# Patient Record
Sex: Female | Born: 1945 | Race: White | Hispanic: No | Marital: Married | Smoking: Never smoker
Health system: Southern US, Community
[De-identification: ages and names within clinical notes are randomized; demographics above are authoritative.]

## PROBLEM LIST (undated history)

## (undated) DIAGNOSIS — M1990 Unspecified osteoarthritis, unspecified site: Secondary | ICD-10-CM

## (undated) DIAGNOSIS — E785 Hyperlipidemia, unspecified: Secondary | ICD-10-CM

## (undated) DIAGNOSIS — T7840XA Allergy, unspecified, initial encounter: Secondary | ICD-10-CM

## (undated) DIAGNOSIS — Z9889 Other specified postprocedural states: Secondary | ICD-10-CM

## (undated) DIAGNOSIS — R112 Nausea with vomiting, unspecified: Secondary | ICD-10-CM

## (undated) DIAGNOSIS — E079 Disorder of thyroid, unspecified: Secondary | ICD-10-CM

## (undated) DIAGNOSIS — M810 Age-related osteoporosis without current pathological fracture: Secondary | ICD-10-CM

## (undated) DIAGNOSIS — J309 Allergic rhinitis, unspecified: Secondary | ICD-10-CM

## (undated) DIAGNOSIS — J45909 Unspecified asthma, uncomplicated: Secondary | ICD-10-CM

## (undated) HISTORY — DX: Allergy, unspecified, initial encounter: T78.40XA

## (undated) HISTORY — DX: Unspecified asthma, uncomplicated: J45.909

## (undated) HISTORY — DX: Unspecified osteoarthritis, unspecified site: M19.90

## (undated) HISTORY — DX: Allergic rhinitis, unspecified: J30.9

## (undated) HISTORY — DX: Disorder of thyroid, unspecified: E07.9

## (undated) HISTORY — DX: Age-related osteoporosis without current pathological fracture: M81.0

## (undated) HISTORY — DX: Other specified postprocedural states: R11.2

## (undated) HISTORY — DX: Hyperlipidemia, unspecified: E78.5

## (undated) HISTORY — DX: Other specified postprocedural states: Z98.890

## (undated) HISTORY — PX: WRIST SURGERY: SHX841

## (undated) HISTORY — PX: TUBAL LIGATION: SHX77

---

## 2014-10-21 DIAGNOSIS — J309 Allergic rhinitis, unspecified: Secondary | ICD-10-CM

## 2014-10-21 DIAGNOSIS — J4520 Mild intermittent asthma, uncomplicated: Secondary | ICD-10-CM | POA: Insufficient documentation

## 2014-10-21 DIAGNOSIS — J3089 Other allergic rhinitis: Secondary | ICD-10-CM | POA: Insufficient documentation

## 2014-11-16 ENCOUNTER — Ambulatory Visit (INDEPENDENT_AMBULATORY_CARE_PROVIDER_SITE_OTHER): Payer: Medicare Other | Admitting: *Deleted

## 2014-11-16 DIAGNOSIS — J309 Allergic rhinitis, unspecified: Secondary | ICD-10-CM | POA: Diagnosis not present

## 2014-12-13 ENCOUNTER — Ambulatory Visit (INDEPENDENT_AMBULATORY_CARE_PROVIDER_SITE_OTHER): Payer: Medicare Other

## 2014-12-13 DIAGNOSIS — J309 Allergic rhinitis, unspecified: Secondary | ICD-10-CM

## 2014-12-22 ENCOUNTER — Ambulatory Visit (INDEPENDENT_AMBULATORY_CARE_PROVIDER_SITE_OTHER): Payer: Medicare Other | Admitting: Internal Medicine

## 2014-12-22 ENCOUNTER — Encounter: Payer: Self-pay | Admitting: Internal Medicine

## 2014-12-22 VITALS — BP 140/86 | HR 86 | Temp 98.3°F | Resp 16

## 2014-12-22 DIAGNOSIS — J301 Allergic rhinitis due to pollen: Secondary | ICD-10-CM

## 2014-12-22 DIAGNOSIS — J4520 Mild intermittent asthma, uncomplicated: Secondary | ICD-10-CM

## 2014-12-22 NOTE — Assessment & Plan Note (Signed)
   Intermittent, currently well controlled.  Continue as needed Proventil.

## 2014-12-22 NOTE — Assessment & Plan Note (Addendum)
   On immunotherapy, currently not well controlled possibly due to fall allergens versus viral syndrome.  Continue Allegra 180 mg daily. Increase fluticasone to 1 spray each nostril twice a day.  Prior to nasal sprays, perform nasal saline lavage for thick mucus twice a day.  Given Prednisone 10 mg tablets.  One tablet twice a day for four days, then one tablet on day #5.  Has EpiPen and action plan-educated on use.

## 2014-12-22 NOTE — Progress Notes (Signed)
12/22/2014  Rebecca Murray Dec 25, 1945 [id number]  Referring provider: Albertina SenegalNelson Pollock, MD [address] [place], [place] [address]  Chief Complaint: Follow-up   Rebecca Murray is a 69 y.o. female who is being seen today for follow-up.   HPI Comments: Allergic rhinitis on immunotherapy: She is on shots every 4 weeks. She has not had any shot reactions. This fall she has had worsening symptoms of sore throat, clear drainage, dry cough. She has not had any interval sinus infections. She does have an EpiPen.  Asthma: Symptoms have been stable since her last visit without any exacerbations or need for her rescue inhaler. She has received a flu shot this year.    ROS: Per HPI unless specifically indicated below Review of Systems   Drug Allergies:  Allergies  Allergen Reactions  . Dextromethorphan Nausea Only    Medications:  Current outpatient prescriptions:  .  albuterol (PROVENTIL HFA) 108 (90 BASE) MCG/ACT inhaler, Inhale 2 puffs into the lungs every 6 (six) hours as needed for wheezing or shortness of breath., Disp: , Rfl:  .  EPINEPHrine (EPIPEN 2-PAK) 0.3 mg/0.3 mL IJ SOAJ injection, Inject 0.3 mg into the muscle Once PRN., Disp: , Rfl:  .  fexofenadine (ALLEGRA) 180 MG tablet, Take 180 mg by mouth daily., Disp: , Rfl:  .  fluticasone (FLONASE) 50 MCG/ACT nasal spray, Place 1 spray into both nostrils 2 (two) times daily., Disp: , Rfl:  .  Multiple Vitamins-Minerals (MULTIVITAMIN ADULT PO), Take by mouth daily., Disp: , Rfl:  .  NON FORMULARY, Every week, Disp: , Rfl:   Physical Exam: BP 140/86 mmHg  Pulse 86  Temp(Src) 98.3 F (36.8 C) (Oral)  Resp 16  Physical Exam  Constitutional: She appears well-developed and well-nourished. No distress.  HENT:  Right Ear: External ear normal.  Left Ear: External ear normal.  Mouth/Throat: Oropharynx is clear and moist.  Hoarse Clear rhinorrhea, b/l  Eyes: Conjunctivae are normal. Right eye exhibits no discharge. Left eye  exhibits no discharge.  Cardiovascular: Normal rate, regular rhythm and normal heart sounds.   No murmur heard. Pulmonary/Chest: Effort normal and breath sounds normal. No respiratory distress. She has no wheezes. She has no rales.  Abdominal: Soft. Bowel sounds are normal.  Musculoskeletal: She exhibits no edema.  Lymphadenopathy:    She has no cervical adenopathy.  Neurological: She is alert.  Skin: No rash noted.  Vitals reviewed.   Diagnostics: FEV1: 80%, FEV1/FVC: 82%. This is a normal study.  Assessment and Plan:  Allergic rhinitis  On immunotherapy, currently not well controlled possibly due to fall allergens versus viral syndrome.  Continue Allegra 180 mg daily. Increase fluticasone to 1 spray each nostril twice a day.  Prior to nasal sprays, perform nasal saline lavage for thick mucus twice a day.  Given Prednisone 10 mg tablets.  One tablet twice a day for four days, then one tablet on day #5.  Has EpiPen and action plan-educated on use.  Intermittent asthma  Intermittent, currently well controlled.  Continue as needed Proventil.    Return in about 6 months (around 06/21/2015).  Thank you for the opportunity to care for this patient.  Please do not hesitate to contact me with questions.  [facility] [address] [place], [place] [address] [phone]

## 2014-12-22 NOTE — Patient Instructions (Signed)
Allergic rhinitis  On immunotherapy, currently not well controlled possibly due to fall allergens versus viral syndrome.  Continue Allegra 180 mg daily. Increase fluticasone to 1 spray each nostril twice a day.  Prior to nasal sprays, perform nasal saline lavage for thick mucus twice a day.  Given Prednisone 10 mg tablets.  One tablet twice a day for four days, then one tablet on day #5.  Has EpiPen and action plan-educated on use.  Intermittent asthma  Intermittent, currently well controlled.  Continue as needed Proventil.

## 2014-12-31 ENCOUNTER — Ambulatory Visit: Payer: Self-pay | Admitting: Internal Medicine

## 2014-12-31 DIAGNOSIS — J3089 Other allergic rhinitis: Secondary | ICD-10-CM | POA: Diagnosis not present

## 2015-01-11 ENCOUNTER — Ambulatory Visit (INDEPENDENT_AMBULATORY_CARE_PROVIDER_SITE_OTHER): Payer: Medicare Other | Admitting: *Deleted

## 2015-01-11 DIAGNOSIS — J309 Allergic rhinitis, unspecified: Secondary | ICD-10-CM

## 2015-02-08 ENCOUNTER — Ambulatory Visit (INDEPENDENT_AMBULATORY_CARE_PROVIDER_SITE_OTHER): Payer: Medicare Other | Admitting: *Deleted

## 2015-02-08 DIAGNOSIS — J309 Allergic rhinitis, unspecified: Secondary | ICD-10-CM

## 2015-02-10 ENCOUNTER — Other Ambulatory Visit: Payer: Self-pay | Admitting: Allergy

## 2015-02-10 MED ORDER — FLUTICASONE PROPIONATE 50 MCG/ACT NA SUSP: 1.0000 | Freq: Two times a day (BID) | NASAL | Status: DC

## 2015-03-07 ENCOUNTER — Ambulatory Visit (INDEPENDENT_AMBULATORY_CARE_PROVIDER_SITE_OTHER): Payer: Medicare Other | Admitting: *Deleted

## 2015-03-07 DIAGNOSIS — J309 Allergic rhinitis, unspecified: Secondary | ICD-10-CM

## 2015-04-04 ENCOUNTER — Ambulatory Visit (INDEPENDENT_AMBULATORY_CARE_PROVIDER_SITE_OTHER): Payer: Medicare Other | Admitting: *Deleted

## 2015-04-04 DIAGNOSIS — J309 Allergic rhinitis, unspecified: Secondary | ICD-10-CM | POA: Diagnosis not present

## 2015-05-02 ENCOUNTER — Ambulatory Visit (INDEPENDENT_AMBULATORY_CARE_PROVIDER_SITE_OTHER): Payer: Medicare Other

## 2015-05-02 DIAGNOSIS — J309 Allergic rhinitis, unspecified: Secondary | ICD-10-CM | POA: Diagnosis not present

## 2015-05-16 ENCOUNTER — Ambulatory Visit (INDEPENDENT_AMBULATORY_CARE_PROVIDER_SITE_OTHER): Payer: Medicare Other

## 2015-05-16 DIAGNOSIS — J309 Allergic rhinitis, unspecified: Secondary | ICD-10-CM

## 2015-05-30 ENCOUNTER — Ambulatory Visit (INDEPENDENT_AMBULATORY_CARE_PROVIDER_SITE_OTHER): Payer: Medicare Other

## 2015-05-30 DIAGNOSIS — J309 Allergic rhinitis, unspecified: Secondary | ICD-10-CM | POA: Diagnosis not present

## 2015-06-13 ENCOUNTER — Ambulatory Visit (INDEPENDENT_AMBULATORY_CARE_PROVIDER_SITE_OTHER): Payer: Medicare Other

## 2015-06-13 DIAGNOSIS — J309 Allergic rhinitis, unspecified: Secondary | ICD-10-CM

## 2015-06-22 ENCOUNTER — Encounter: Payer: Self-pay | Admitting: Internal Medicine

## 2015-06-22 ENCOUNTER — Ambulatory Visit (INDEPENDENT_AMBULATORY_CARE_PROVIDER_SITE_OTHER): Payer: Medicare Other | Admitting: Internal Medicine

## 2015-06-22 VITALS — BP 142/86 | HR 82 | Temp 98.2°F | Resp 20 | Ht 64.57 in | Wt 202.6 lb

## 2015-06-22 DIAGNOSIS — J3089 Other allergic rhinitis: Secondary | ICD-10-CM

## 2015-06-22 DIAGNOSIS — J4520 Mild intermittent asthma, uncomplicated: Secondary | ICD-10-CM | POA: Diagnosis not present

## 2015-06-22 MED ORDER — ALBUTEROL SULFATE HFA 108 (90 BASE) MCG/ACT IN AERS: 2.0000 | INHALATION_SPRAY | RESPIRATORY_TRACT | Status: DC | PRN

## 2015-06-22 MED ORDER — FLUTICASONE PROPIONATE 50 MCG/ACT NA SUSP: 1.0000 | Freq: Two times a day (BID) | NASAL | Status: DC

## 2015-06-22 MED ORDER — EPINEPHRINE 0.3 MG/0.3ML IJ SOAJ: INTRAMUSCULAR | Status: DC

## 2015-06-22 NOTE — Patient Instructions (Signed)
Allergic rhinitis  On immunotherapy, currently well controlled   Continue Allegra 180 mg daily and fluticasone 1 spray each nostril twice a day. Aim away from the septum when using fluticasone  Prior to nasal sprays, perform nasal saline lavage for thick mucus twice a day.  For nasal bleeding or dryness, may apply Vaseline to nasal septum twice a day as needed  Has EpiPen and action plan-educated on use.   Intermittent asthma  Intermittent, currently well controlled.  Continue as needed Proventil.

## 2015-06-22 NOTE — Assessment & Plan Note (Signed)
   On immunotherapy, currently well controlled   Continue Allegra 180 mg daily and fluticasone 1 spray each nostril twice a day. Aim away from the septum when using fluticasone  Prior to nasal sprays, perform nasal saline lavage for thick mucus twice a day.  For nasal bleeding or dryness, may apply Vaseline to nasal septum twice a day as needed  Has EpiPen and action plan-educated on use.

## 2015-06-22 NOTE — Assessment & Plan Note (Signed)
   Intermittent, currently well controlled.  Continue as needed Proventil. 

## 2015-06-22 NOTE — Progress Notes (Signed)
History of Present Illness: Rebecca Murray is a 70 y.o. female presenting for follow-up  HPI Comments: Allergic rhinitis on immunotherapy: She is on shots every 4 weeks. She has not had any shot reactions. At last visit, she was given a prednisone burst due to increased symptoms due to fall allergens and she did well. She has not had any interval sinus infections.  Asthma: Symptoms have been stable since her last visit without any exacerbations or need for her rescue inhaler. She has rarely required albuterol   Assessment and Plan: Allergic rhinitis  On immunotherapy, currently well controlled   Continue Allegra 180 mg daily and fluticasone 1 spray each nostril twice a day. Aim away from the septum when using fluticasone  Prior to nasal sprays, perform nasal saline lavage for thick mucus twice a day.  For nasal bleeding or dryness, may apply Vaseline to nasal septum twice a day as needed  Has EpiPen and action plan-educated on use.   Intermittent asthma  Intermittent, currently well controlled.  Continue as needed Proventil.    Return in about 1 year (around 06/21/2016).  Current Outpatient Prescriptions on File Prior to Visit  Medication Sig Dispense Refill  . fexofenadine (ALLEGRA) 180 MG tablet Take 180 mg by mouth daily.    . Multiple Vitamins-Minerals (MULTIVITAMIN ADULT PO) Take by mouth daily.    . NON FORMULARY Every week     No current facility-administered medications on file prior to visit.    Meds ordered this encounter  Medications  . DISCONTD: ALBUTEROL SULFATE HFA IN    Sig: Inhale into the lungs.  Marland Kitchen albuterol (PROVENTIL HFA) 108 (90 Base) MCG/ACT inhaler    Sig: Inhale 2 puffs into the lungs every 4 (four) hours as needed for wheezing or shortness of breath.    Dispense:  1 Inhaler    Refill:  1  . fluticasone (FLONASE) 50 MCG/ACT nasal spray    Sig: Place 1 spray into both nostrils 2 (two) times daily.    Dispense:  16 g    Refill:  5    For stuffy  nose or drainage  . EPINEPHrine (EPIPEN 2-PAK) 0.3 mg/0.3 mL IJ SOAJ injection    Sig: Use as directed for severe allergic reaction    Dispense:  2 Device    Refill:  1    Dispense mylan generic brand only    Diagnostics: Spirometry: FEV1 2.08 L or 87 %, FEV1/FVC  85 %.  This is consistent with mild restriction. Full reversibility in the past.  Physical Exam: BP 142/86 mmHg  Pulse 82  Temp(Src) 98.2 F (36.8 C) (Oral)  Resp 20  Ht 5' 4.57" (1.64 m)  Wt 202 lb 9.6 oz (91.9 kg)  BMI 34.17 kg/m2   Physical Exam  Constitutional: She appears well-developed and well-nourished. No distress.  HENT:  Right Ear: External ear normal.  Left Ear: External ear normal.  Mouth/Throat: Oropharynx is clear and moist.  Nasal septum dry with bleeding points noted  Eyes: Conjunctivae are normal. Right eye exhibits no discharge. Left eye exhibits no discharge.  Cardiovascular: Normal rate, regular rhythm and normal heart sounds.   No murmur heard. Pulmonary/Chest: Effort normal and breath sounds normal. No respiratory distress. She has no wheezes. She has no rales.  Abdominal: Soft. Bowel sounds are normal.  Musculoskeletal: She exhibits no edema.  Lymphadenopathy:    She has no cervical adenopathy.  Neurological: She is alert.  Skin: No rash noted.  Vitals reviewed.   Patient Active  Problem List   Diagnosis Date Noted  . Intermittent asthma 10/21/2014  . Allergic rhinitis 10/21/2014    Drug Allergies:  Allergies  Allergen Reactions  . Dextromethorphan Nausea Only    ROS: Per HPI unless specifically indicated below Review of Systems  Thank you for the opportunity to care for this patient.  Please do not hesitate to contact me with questions.

## 2015-06-27 ENCOUNTER — Ambulatory Visit (INDEPENDENT_AMBULATORY_CARE_PROVIDER_SITE_OTHER): Payer: Medicare Other

## 2015-06-27 DIAGNOSIS — J309 Allergic rhinitis, unspecified: Secondary | ICD-10-CM

## 2015-07-06 ENCOUNTER — Ambulatory Visit
Admission: RE | Admit: 2015-07-06 | Discharge: 2015-07-06 | Disposition: A | Discharge status: Home / Self Care | Payer: Medicare Other | Source: Ambulatory Visit | Attending: Family Medicine | Admitting: Family Medicine

## 2015-07-06 ENCOUNTER — Other Ambulatory Visit: Payer: Self-pay | Admitting: Family Medicine

## 2015-07-06 DIAGNOSIS — M25569 Pain in unspecified knee: Secondary | ICD-10-CM

## 2015-07-12 ENCOUNTER — Ambulatory Visit (INDEPENDENT_AMBULATORY_CARE_PROVIDER_SITE_OTHER): Payer: Medicare Other

## 2015-07-12 DIAGNOSIS — J309 Allergic rhinitis, unspecified: Secondary | ICD-10-CM | POA: Diagnosis not present

## 2015-07-29 DIAGNOSIS — J3089 Other allergic rhinitis: Secondary | ICD-10-CM | POA: Diagnosis not present

## 2015-08-08 ENCOUNTER — Ambulatory Visit (INDEPENDENT_AMBULATORY_CARE_PROVIDER_SITE_OTHER): Payer: Medicare Other

## 2015-08-08 DIAGNOSIS — J309 Allergic rhinitis, unspecified: Secondary | ICD-10-CM

## 2015-08-31 NOTE — Progress Notes (Signed)
This encounter was created in error - please disregard.

## 2015-09-05 ENCOUNTER — Ambulatory Visit (INDEPENDENT_AMBULATORY_CARE_PROVIDER_SITE_OTHER): Payer: Medicare Other

## 2015-09-05 DIAGNOSIS — J309 Allergic rhinitis, unspecified: Secondary | ICD-10-CM | POA: Diagnosis not present

## 2015-10-03 ENCOUNTER — Ambulatory Visit (INDEPENDENT_AMBULATORY_CARE_PROVIDER_SITE_OTHER): Payer: Medicare Other

## 2015-10-03 DIAGNOSIS — J309 Allergic rhinitis, unspecified: Secondary | ICD-10-CM

## 2015-10-31 ENCOUNTER — Ambulatory Visit (INDEPENDENT_AMBULATORY_CARE_PROVIDER_SITE_OTHER): Payer: Medicare Other

## 2015-10-31 DIAGNOSIS — J309 Allergic rhinitis, unspecified: Secondary | ICD-10-CM | POA: Diagnosis not present

## 2015-11-28 ENCOUNTER — Ambulatory Visit (INDEPENDENT_AMBULATORY_CARE_PROVIDER_SITE_OTHER): Payer: Medicare Other

## 2015-11-28 DIAGNOSIS — J309 Allergic rhinitis, unspecified: Secondary | ICD-10-CM | POA: Diagnosis not present

## 2015-12-26 ENCOUNTER — Ambulatory Visit (INDEPENDENT_AMBULATORY_CARE_PROVIDER_SITE_OTHER): Payer: Medicare Other

## 2015-12-26 DIAGNOSIS — J309 Allergic rhinitis, unspecified: Secondary | ICD-10-CM

## 2016-01-11 ENCOUNTER — Encounter: Payer: Self-pay | Admitting: Family Medicine

## 2016-01-23 ENCOUNTER — Ambulatory Visit (INDEPENDENT_AMBULATORY_CARE_PROVIDER_SITE_OTHER): Payer: Medicare Other

## 2016-01-23 DIAGNOSIS — J309 Allergic rhinitis, unspecified: Secondary | ICD-10-CM

## 2016-02-20 ENCOUNTER — Ambulatory Visit (INDEPENDENT_AMBULATORY_CARE_PROVIDER_SITE_OTHER): Payer: Medicare Other

## 2016-02-20 DIAGNOSIS — J309 Allergic rhinitis, unspecified: Secondary | ICD-10-CM | POA: Diagnosis not present

## 2016-03-19 ENCOUNTER — Ambulatory Visit (INDEPENDENT_AMBULATORY_CARE_PROVIDER_SITE_OTHER): Payer: Medicare Other

## 2016-03-19 DIAGNOSIS — J309 Allergic rhinitis, unspecified: Secondary | ICD-10-CM | POA: Diagnosis not present

## 2016-03-23 NOTE — Addendum Note (Signed)
Addended by: Virl SonGAINEY, Cia Garretson D on: 03/23/2016 04:05 PM   Modules accepted: Orders

## 2016-04-02 ENCOUNTER — Ambulatory Visit (INDEPENDENT_AMBULATORY_CARE_PROVIDER_SITE_OTHER): Payer: Medicare Other

## 2016-04-02 DIAGNOSIS — J309 Allergic rhinitis, unspecified: Secondary | ICD-10-CM

## 2016-04-16 ENCOUNTER — Ambulatory Visit (INDEPENDENT_AMBULATORY_CARE_PROVIDER_SITE_OTHER): Payer: Medicare Other

## 2016-04-16 DIAGNOSIS — J309 Allergic rhinitis, unspecified: Secondary | ICD-10-CM | POA: Diagnosis not present

## 2016-05-03 ENCOUNTER — Encounter: Payer: Self-pay | Admitting: *Deleted

## 2016-05-08 DIAGNOSIS — J301 Allergic rhinitis due to pollen: Secondary | ICD-10-CM | POA: Diagnosis not present

## 2016-05-14 ENCOUNTER — Ambulatory Visit (INDEPENDENT_AMBULATORY_CARE_PROVIDER_SITE_OTHER): Payer: Medicare Other

## 2016-05-14 DIAGNOSIS — J309 Allergic rhinitis, unspecified: Secondary | ICD-10-CM

## 2016-06-11 ENCOUNTER — Ambulatory Visit (INDEPENDENT_AMBULATORY_CARE_PROVIDER_SITE_OTHER): Payer: Medicare Other

## 2016-06-11 DIAGNOSIS — J309 Allergic rhinitis, unspecified: Secondary | ICD-10-CM

## 2016-06-28 ENCOUNTER — Ambulatory Visit: Payer: Medicare Other | Admitting: Allergy and Immunology

## 2016-07-10 ENCOUNTER — Encounter: Payer: Self-pay | Admitting: Pediatrics

## 2016-07-10 ENCOUNTER — Ambulatory Visit (INDEPENDENT_AMBULATORY_CARE_PROVIDER_SITE_OTHER): Payer: Medicare Other | Admitting: Pediatrics

## 2016-07-10 VITALS — BP 114/62 | HR 87 | Temp 98.4°F | Resp 16

## 2016-07-10 DIAGNOSIS — J301 Allergic rhinitis due to pollen: Secondary | ICD-10-CM | POA: Diagnosis not present

## 2016-07-10 DIAGNOSIS — J309 Allergic rhinitis, unspecified: Secondary | ICD-10-CM | POA: Diagnosis not present

## 2016-07-10 DIAGNOSIS — J4520 Mild intermittent asthma, uncomplicated: Secondary | ICD-10-CM | POA: Diagnosis not present

## 2016-07-10 MED ORDER — ALBUTEROL SULFATE HFA 108 (90 BASE) MCG/ACT IN AERS: INHALATION_SPRAY | RESPIRATORY_TRACT | 1 refills | Status: DC

## 2016-07-10 MED ORDER — FLUTICASONE PROPIONATE 50 MCG/ACT NA SUSP: NASAL | 5 refills | Status: DC

## 2016-07-10 NOTE — Patient Instructions (Signed)
Continue on allergy injections every 4 weeks Allegra 180 mg once a day for runny nose Fluticasone 1 spray per nostril twice a day if needed for stuffy nose Proventil 2 puffs every 4 hours if needed for wheezing or coughing spells Call me if you're not doing better on this treatment plan

## 2016-07-10 NOTE — Progress Notes (Signed)
  [address] [place] [address] Dept: [phone]  FOLLOW UP NOTE  Patient ID: Rebecca Murray, female    DOB: 08-09-1945  Age: 71 y.o. MRN: [id number] Date of Office Visit: 07/10/2016  Assessment  Chief Complaint: Asthma (doing well)  HPI Rebecca Murray presents for follow-up of asthma and allergic rhinitis. She is on allergy injections every 4 weeks. She would like to continue on allergy injections  Current medications are Allegra 180 mg once a day, fluticasone 1 spray per nostril twice a day,  Proventil 2 puffs every 4 hours if needed. Her other medications are outlined in the chart   Drug Allergies:  Allergies  Allergen Reactions  . Dextromethorphan Nausea Only    Physical Exam: BP 114/62   Pulse 87   Temp 98.4 F (36.9 C) (Oral)   Resp 16   SpO2 94%    Physical Exam  Constitutional: She is oriented to person, place, and time. She appears well-developed and well-nourished.  HENT:  Eyes normal. Ears normal. Nose normal. Pharynx normal.  Neck: Neck supple. No thyromegaly present.  Cardiovascular:  S1 and S2 normal no murmurs  Pulmonary/Chest:  Clear to percussion and auscultation  Abdominal: Soft. There is no tenderness.  Lymphadenopathy:    She has no cervical adenopathy.  Neurological: She is alert and oriented to person, place, and time.  Psychiatric: She has a normal mood and affect. Her behavior is normal. Judgment and thought content normal.    Diagnostics:  FVC 2.48 L FEV1 2.07 L. Predicted FVC 3.11 L predicted FEV1 2.26 L-the spirometry is in the normal range  Assessment and Plan: 1. Mild intermittent asthma without complication   2. Allergic rhinitis, unspecified seasonality, unspecified trigger   3. Seasonal allergic rhinitis due to pollen     Meds ordered this encounter  Medications  . fluticasone (FLONASE) 50 MCG/ACT nasal spray    Sig: One spray in each nostril twice a day for stuffy nose    Dispense:  16 g    Refill:  5    For  stuffy nose or drainage  . albuterol (PROVENTIL HFA) 108 (90 Base) MCG/ACT inhaler    Sig: 2 puffs every 4 hours as needed for coughing or wheezing    Dispense:  1 Inhaler    Refill:  1    Patient Instructions  Continue on allergy injections every 4 weeks Allegra 180 mg once a day for runny nose Fluticasone 1 spray per nostril twice a day if needed for stuffy nose Proventil 2 puffs every 4 hours if needed for wheezing or coughing spells Call me if you're not doing better on this treatment plan   Return in about 1 year (around 07/10/2017).    Thank you for the opportunity to care for this patient.  Please do not hesitate to contact me with questions.  Tonette BihariJ. A. Abby Tucholski, M.D.  [facility] [address] [place], [place] [address] [phone]

## 2016-08-07 ENCOUNTER — Ambulatory Visit (INDEPENDENT_AMBULATORY_CARE_PROVIDER_SITE_OTHER): Payer: Medicare Other

## 2016-08-07 DIAGNOSIS — J309 Allergic rhinitis, unspecified: Secondary | ICD-10-CM

## 2016-09-04 ENCOUNTER — Ambulatory Visit (INDEPENDENT_AMBULATORY_CARE_PROVIDER_SITE_OTHER): Payer: Medicare Other | Admitting: *Deleted

## 2016-09-04 DIAGNOSIS — J309 Allergic rhinitis, unspecified: Secondary | ICD-10-CM

## 2016-09-14 DIAGNOSIS — J301 Allergic rhinitis due to pollen: Secondary | ICD-10-CM | POA: Diagnosis not present

## 2016-09-17 NOTE — Progress Notes (Signed)
VIAL EXP 09-20-17 

## 2016-09-18 ENCOUNTER — Ambulatory Visit (INDEPENDENT_AMBULATORY_CARE_PROVIDER_SITE_OTHER): Payer: Medicare Other

## 2016-09-18 DIAGNOSIS — J309 Allergic rhinitis, unspecified: Secondary | ICD-10-CM | POA: Diagnosis not present

## 2016-10-01 ENCOUNTER — Ambulatory Visit (INDEPENDENT_AMBULATORY_CARE_PROVIDER_SITE_OTHER): Payer: Medicare Other | Admitting: *Deleted

## 2016-10-01 DIAGNOSIS — J309 Allergic rhinitis, unspecified: Secondary | ICD-10-CM | POA: Diagnosis not present

## 2016-10-16 ENCOUNTER — Ambulatory Visit (INDEPENDENT_AMBULATORY_CARE_PROVIDER_SITE_OTHER): Payer: Medicare Other

## 2016-10-16 DIAGNOSIS — J309 Allergic rhinitis, unspecified: Secondary | ICD-10-CM

## 2016-10-29 ENCOUNTER — Ambulatory Visit (INDEPENDENT_AMBULATORY_CARE_PROVIDER_SITE_OTHER): Payer: Medicare Other | Admitting: *Deleted

## 2016-10-29 DIAGNOSIS — J309 Allergic rhinitis, unspecified: Secondary | ICD-10-CM | POA: Diagnosis not present

## 2016-11-26 ENCOUNTER — Ambulatory Visit (INDEPENDENT_AMBULATORY_CARE_PROVIDER_SITE_OTHER): Payer: Medicare Other

## 2016-11-26 DIAGNOSIS — J309 Allergic rhinitis, unspecified: Secondary | ICD-10-CM | POA: Diagnosis not present

## 2016-12-14 ENCOUNTER — Other Ambulatory Visit: Payer: Self-pay | Admitting: Family Medicine

## 2016-12-14 DIAGNOSIS — R5381 Other malaise: Secondary | ICD-10-CM

## 2016-12-20 ENCOUNTER — Other Ambulatory Visit: Payer: Self-pay | Admitting: Family Medicine

## 2016-12-20 DIAGNOSIS — E559 Vitamin D deficiency, unspecified: Secondary | ICD-10-CM

## 2016-12-20 DIAGNOSIS — Z139 Encounter for screening, unspecified: Secondary | ICD-10-CM

## 2016-12-20 DIAGNOSIS — E039 Hypothyroidism, unspecified: Secondary | ICD-10-CM

## 2016-12-24 ENCOUNTER — Ambulatory Visit (INDEPENDENT_AMBULATORY_CARE_PROVIDER_SITE_OTHER): Payer: Medicare Other

## 2016-12-24 DIAGNOSIS — J309 Allergic rhinitis, unspecified: Secondary | ICD-10-CM

## 2017-01-17 ENCOUNTER — Encounter: Payer: Self-pay | Admitting: Family Medicine

## 2017-01-21 ENCOUNTER — Other Ambulatory Visit: Payer: Medicare Other

## 2017-01-21 ENCOUNTER — Ambulatory Visit: Payer: Medicare Other

## 2017-01-22 ENCOUNTER — Other Ambulatory Visit: Payer: Medicare Other

## 2017-01-22 ENCOUNTER — Ambulatory Visit: Payer: Medicare Other

## 2017-01-24 ENCOUNTER — Ambulatory Visit (INDEPENDENT_AMBULATORY_CARE_PROVIDER_SITE_OTHER): Payer: Medicare Other

## 2017-01-24 DIAGNOSIS — J309 Allergic rhinitis, unspecified: Secondary | ICD-10-CM | POA: Diagnosis not present

## 2017-02-18 ENCOUNTER — Ambulatory Visit (INDEPENDENT_AMBULATORY_CARE_PROVIDER_SITE_OTHER): Payer: Medicare Other

## 2017-02-18 DIAGNOSIS — J309 Allergic rhinitis, unspecified: Secondary | ICD-10-CM

## 2017-02-21 ENCOUNTER — Ambulatory Visit
Admission: RE | Admit: 2017-02-21 | Discharge: 2017-02-21 | Disposition: A | Discharge status: Home / Self Care | Payer: Medicare Other | Source: Ambulatory Visit | Attending: Family Medicine | Admitting: Family Medicine

## 2017-02-21 ENCOUNTER — Encounter: Payer: Self-pay | Admitting: Radiology

## 2017-02-21 DIAGNOSIS — E039 Hypothyroidism, unspecified: Secondary | ICD-10-CM

## 2017-02-21 DIAGNOSIS — Z139 Encounter for screening, unspecified: Secondary | ICD-10-CM

## 2017-02-21 DIAGNOSIS — E559 Vitamin D deficiency, unspecified: Secondary | ICD-10-CM

## 2017-03-18 ENCOUNTER — Ambulatory Visit (INDEPENDENT_AMBULATORY_CARE_PROVIDER_SITE_OTHER): Payer: Medicare Other

## 2017-03-18 DIAGNOSIS — J309 Allergic rhinitis, unspecified: Secondary | ICD-10-CM

## 2017-03-25 ENCOUNTER — Other Ambulatory Visit: Payer: Self-pay | Admitting: Allergy

## 2017-03-25 MED ORDER — FLUTICASONE PROPIONATE 50 MCG/ACT NA SUSP: NASAL | 5 refills | Status: DC

## 2017-04-15 ENCOUNTER — Ambulatory Visit (INDEPENDENT_AMBULATORY_CARE_PROVIDER_SITE_OTHER): Payer: Medicare Other

## 2017-04-15 DIAGNOSIS — J309 Allergic rhinitis, unspecified: Secondary | ICD-10-CM | POA: Diagnosis not present

## 2017-05-13 ENCOUNTER — Ambulatory Visit (INDEPENDENT_AMBULATORY_CARE_PROVIDER_SITE_OTHER): Payer: Medicare Other | Admitting: *Deleted

## 2017-05-13 DIAGNOSIS — J309 Allergic rhinitis, unspecified: Secondary | ICD-10-CM | POA: Diagnosis not present

## 2017-05-27 ENCOUNTER — Ambulatory Visit (INDEPENDENT_AMBULATORY_CARE_PROVIDER_SITE_OTHER): Payer: Medicare Other | Admitting: *Deleted

## 2017-05-27 DIAGNOSIS — J309 Allergic rhinitis, unspecified: Secondary | ICD-10-CM | POA: Diagnosis not present

## 2017-06-10 ENCOUNTER — Ambulatory Visit (INDEPENDENT_AMBULATORY_CARE_PROVIDER_SITE_OTHER): Payer: Medicare Other

## 2017-06-10 DIAGNOSIS — J309 Allergic rhinitis, unspecified: Secondary | ICD-10-CM | POA: Diagnosis not present

## 2017-06-24 ENCOUNTER — Ambulatory Visit (INDEPENDENT_AMBULATORY_CARE_PROVIDER_SITE_OTHER): Payer: Medicare Other

## 2017-06-24 DIAGNOSIS — J309 Allergic rhinitis, unspecified: Secondary | ICD-10-CM | POA: Diagnosis not present

## 2017-07-09 ENCOUNTER — Ambulatory Visit (INDEPENDENT_AMBULATORY_CARE_PROVIDER_SITE_OTHER): Payer: Medicare Other

## 2017-07-09 DIAGNOSIS — J309 Allergic rhinitis, unspecified: Secondary | ICD-10-CM | POA: Diagnosis not present

## 2017-07-15 ENCOUNTER — Encounter: Payer: Self-pay | Admitting: Pediatrics

## 2017-07-15 ENCOUNTER — Ambulatory Visit: Payer: Medicare Other | Admitting: Pediatrics

## 2017-07-15 VITALS — BP 136/70 | HR 80 | Temp 98.0°F | Resp 16 | Ht 65.5 in | Wt 188.0 lb

## 2017-07-15 DIAGNOSIS — J4520 Mild intermittent asthma, uncomplicated: Secondary | ICD-10-CM | POA: Diagnosis not present

## 2017-07-15 DIAGNOSIS — I10 Essential (primary) hypertension: Secondary | ICD-10-CM | POA: Diagnosis not present

## 2017-07-15 DIAGNOSIS — I159 Secondary hypertension, unspecified: Secondary | ICD-10-CM | POA: Insufficient documentation

## 2017-07-15 DIAGNOSIS — J309 Allergic rhinitis, unspecified: Secondary | ICD-10-CM | POA: Diagnosis not present

## 2017-07-15 MED ORDER — ALBUTEROL SULFATE HFA 108 (90 BASE) MCG/ACT IN AERS: INHALATION_SPRAY | RESPIRATORY_TRACT | 1 refills | Status: DC

## 2017-07-15 MED ORDER — EPINEPHRINE 0.3 MG/0.3ML IJ SOAJ: INTRAMUSCULAR | 1 refills | Status: DC

## 2017-07-15 NOTE — Patient Instructions (Signed)
Continue on allergy injections every 4 weeks Allegra 180 mg once a day as needed for runny nose Fluticasone 1 spray per nostril twice a day if needed for stuffy nose Continue nasal saline spray before medicated nasal spray Proventil 2 puffs every 4 hours if needed for wheezing or coughing spells Continue the other medications as listed in your chart  Call me if you're not doing better on this treatment plan  Follow up in 1 year or sooner

## 2017-07-15 NOTE — Progress Notes (Signed)
[address] [place] [address] Dept: [phone]  FOLLOW UP NOTE  Patient ID: Rebecca Murray, female    DOB: [birth year]  Age: 72 y.o. MRN: [id number] Date of Office Visit: 07/15/2017  Assessment  Chief Complaint: Allergies  HPI Rebecca Murray is a 72 year old female who presents to the clinic today for a follow up visit. She was last seen in this clinic on 07/10/2016 by Dr. Beaulah Dinning for evaluation of asthma and allergic rhinitis. At that time, she continued allergen immunotherapy once a month as well as Allegra once a day, Flonase, and albuterol inhaler as needed.  At today's visit, she reports she has done well overall. Her asthma is reported as well controlled with some intermittent shortness of breath that only occurs with vigorous activity which resolves with one inhalation of albuterol via inhaler. She denies wheeze and cough. She currently uses her albuterol inhaler 0-2 times a week with relief of symptoms.  Allergic rhinitis is reported as well controlled. She reports allergy shots are going well with no adverse reactions. She reports allergy shots have made a significant improvement in her allergic rhinitis. She continues Flonase, saline nasal rinses, and Allegra.   Her current medications are listed in the chart.   Drug Allergies:  Allergies  Allergen Reactions  . Dextromethorphan Nausea Only    Physical Exam: BP 136/70   Pulse 80   Temp 98 F (36.7 C) (Oral)   Resp 16   Ht 5' 5.5" (1.664 m)   Wt 188 lb (85.3 kg)   SpO2 95%   BMI 30.81 kg/m    Physical Exam  Constitutional: She appears well-developed and well-nourished.  HENT:  Head: Normocephalic.  Right Ear: External ear normal.  Left Ear: External ear normal.  Bilateral nares slightly erythematous and edematous with no nasal drainage noted. Pharynx normal. Ears normal. Eyes normal.   Eyes: Conjunctivae are normal.  Neck: Normal range of motion. Neck supple.  Cardiovascular: Normal rate, regular  rhythm and normal heart sounds.  No murmur noted  Pulmonary/Chest: Effort normal and breath sounds normal.  Lungs clear to auscultation  Musculoskeletal: Normal range of motion.  Neurological: She is alert.  Skin: Skin is warm and dry.  Psychiatric: She has a normal mood and affect. Her behavior is normal. Judgment and thought content normal.    Diagnostics: FVC 2.75, FEV1 2.25. Predicted FVC 3.08, predicted FEV1 2.33. Spirometry is within the normal range.   Assessment and Plan: 1. Mild intermittent asthma without complication   2. Allergic rhinitis, unspecified seasonality, unspecified trigger   3. Essential hypertension     Meds ordered this encounter  Medications  . albuterol (PROVENTIL HFA) 108 (90 Base) MCG/ACT inhaler    Sig: 2 puffs every 4 hours as needed for coughing or wheezing    Dispense:  1 Inhaler    Refill:  1  . EPINEPHrine (EPIPEN 2-PAK) 0.3 mg/0.3 mL IJ SOAJ injection    Sig: Use as directed for severe allergic reaction    Dispense:  2 Device    Refill:  1    Dispense mylan generic brand only    Patient Instructions  Continue on allergy injections every 4 weeks Allegra 180 mg once a day as needed for runny nose Fluticasone 1 spray per nostril twice a day if needed for stuffy nose Continue nasal saline spray before medicated nasal spray Proventil 2 puffs every 4 hours if needed for wheezing or coughing spells Continue the other medications as listed in your chart  Call me if you're not doing better on this treatment plan  Follow up in 1 year or sooner   Return in about 1 year (around 07/16/2018), or if symptoms worsen or fail to improve.   Thank you for the opportunity to care for this patient.  Please do not hesitate to contact me with questions.  Thermon LeylandAnne Ambs, FNP [facility]  I have provided oversight concerning Thermon Leylandnne Ambs' evaluation and treatment of this patient's health issues addressed  during today's encounter. I agree with the assessment and therapeutic plan as outlined in the note.   Thank you for the opportunity to care for this patient.  Please do not hesitate to contact me with questions.  Tonette BihariJ. A. Freddy Kinne, M.D.  [facility] [address] [place], [place] [address] [phone]

## 2017-08-06 ENCOUNTER — Ambulatory Visit (INDEPENDENT_AMBULATORY_CARE_PROVIDER_SITE_OTHER): Payer: Medicare Other

## 2017-08-06 DIAGNOSIS — J4520 Mild intermittent asthma, uncomplicated: Secondary | ICD-10-CM

## 2017-08-06 DIAGNOSIS — J309 Allergic rhinitis, unspecified: Secondary | ICD-10-CM

## 2017-09-02 ENCOUNTER — Ambulatory Visit (INDEPENDENT_AMBULATORY_CARE_PROVIDER_SITE_OTHER): Payer: Medicare Other

## 2017-09-02 DIAGNOSIS — J309 Allergic rhinitis, unspecified: Secondary | ICD-10-CM | POA: Diagnosis not present

## 2017-09-18 NOTE — Progress Notes (Signed)
Vial exp 09-20-18

## 2017-09-20 DIAGNOSIS — J301 Allergic rhinitis due to pollen: Secondary | ICD-10-CM

## 2017-10-01 ENCOUNTER — Ambulatory Visit (INDEPENDENT_AMBULATORY_CARE_PROVIDER_SITE_OTHER): Payer: Medicare Other | Admitting: *Deleted

## 2017-10-01 DIAGNOSIS — J309 Allergic rhinitis, unspecified: Secondary | ICD-10-CM

## 2017-10-15 ENCOUNTER — Ambulatory Visit (INDEPENDENT_AMBULATORY_CARE_PROVIDER_SITE_OTHER): Payer: Medicare Other | Admitting: *Deleted

## 2017-10-15 DIAGNOSIS — J309 Allergic rhinitis, unspecified: Secondary | ICD-10-CM

## 2017-10-28 ENCOUNTER — Ambulatory Visit (INDEPENDENT_AMBULATORY_CARE_PROVIDER_SITE_OTHER): Payer: Medicare Other

## 2017-10-28 ENCOUNTER — Other Ambulatory Visit: Payer: Self-pay | Admitting: Allergy

## 2017-10-28 DIAGNOSIS — J309 Allergic rhinitis, unspecified: Secondary | ICD-10-CM

## 2017-10-28 MED ORDER — FLUTICASONE PROPIONATE 50 MCG/ACT NA SUSP: NASAL | 5 refills | Status: DC

## 2017-11-11 ENCOUNTER — Ambulatory Visit (INDEPENDENT_AMBULATORY_CARE_PROVIDER_SITE_OTHER): Payer: Medicare Other

## 2017-11-11 DIAGNOSIS — J309 Allergic rhinitis, unspecified: Secondary | ICD-10-CM

## 2017-11-25 ENCOUNTER — Ambulatory Visit (INDEPENDENT_AMBULATORY_CARE_PROVIDER_SITE_OTHER): Payer: Medicare Other

## 2017-11-25 DIAGNOSIS — J309 Allergic rhinitis, unspecified: Secondary | ICD-10-CM | POA: Diagnosis not present

## 2017-12-23 ENCOUNTER — Ambulatory Visit (INDEPENDENT_AMBULATORY_CARE_PROVIDER_SITE_OTHER): Payer: Medicare Other

## 2017-12-23 DIAGNOSIS — J309 Allergic rhinitis, unspecified: Secondary | ICD-10-CM

## 2017-12-25 NOTE — Progress Notes (Signed)
Vial exp 12-26-18 

## 2017-12-30 DIAGNOSIS — J301 Allergic rhinitis due to pollen: Secondary | ICD-10-CM

## 2018-01-08 ENCOUNTER — Other Ambulatory Visit: Payer: Self-pay | Admitting: Family Medicine

## 2018-01-08 DIAGNOSIS — Z1231 Encounter for screening mammogram for malignant neoplasm of breast: Secondary | ICD-10-CM

## 2018-01-20 ENCOUNTER — Ambulatory Visit (INDEPENDENT_AMBULATORY_CARE_PROVIDER_SITE_OTHER): Payer: Medicare Other

## 2018-01-20 DIAGNOSIS — J3089 Other allergic rhinitis: Secondary | ICD-10-CM | POA: Diagnosis not present

## 2018-01-20 DIAGNOSIS — J309 Allergic rhinitis, unspecified: Secondary | ICD-10-CM

## 2018-02-17 ENCOUNTER — Ambulatory Visit (INDEPENDENT_AMBULATORY_CARE_PROVIDER_SITE_OTHER): Payer: Medicare Other

## 2018-02-17 DIAGNOSIS — J309 Allergic rhinitis, unspecified: Secondary | ICD-10-CM

## 2018-02-25 ENCOUNTER — Ambulatory Visit
Admission: RE | Admit: 2018-02-25 | Discharge: 2018-02-25 | Disposition: A | Discharge status: Home / Self Care | Payer: Medicare Other | Source: Ambulatory Visit | Attending: Family Medicine | Admitting: Family Medicine

## 2018-02-25 DIAGNOSIS — Z1231 Encounter for screening mammogram for malignant neoplasm of breast: Secondary | ICD-10-CM

## 2018-03-07 ENCOUNTER — Encounter: Payer: Self-pay | Admitting: Family Medicine

## 2018-03-19 ENCOUNTER — Ambulatory Visit (INDEPENDENT_AMBULATORY_CARE_PROVIDER_SITE_OTHER): Payer: Medicare Other

## 2018-03-19 DIAGNOSIS — J309 Allergic rhinitis, unspecified: Secondary | ICD-10-CM | POA: Diagnosis not present

## 2018-04-04 IMAGING — CR DG KNEE COMPLETE 4+V*R*
4 series · 4 of 4 positions shown · non-contrast
Comparison: None.

CLINICAL DATA: Chronic bilateral knee pain for years, right greater
than left

EXAM:
RIGHT KNEE - COMPLETE 4+ VIEW

[w knee ap right]
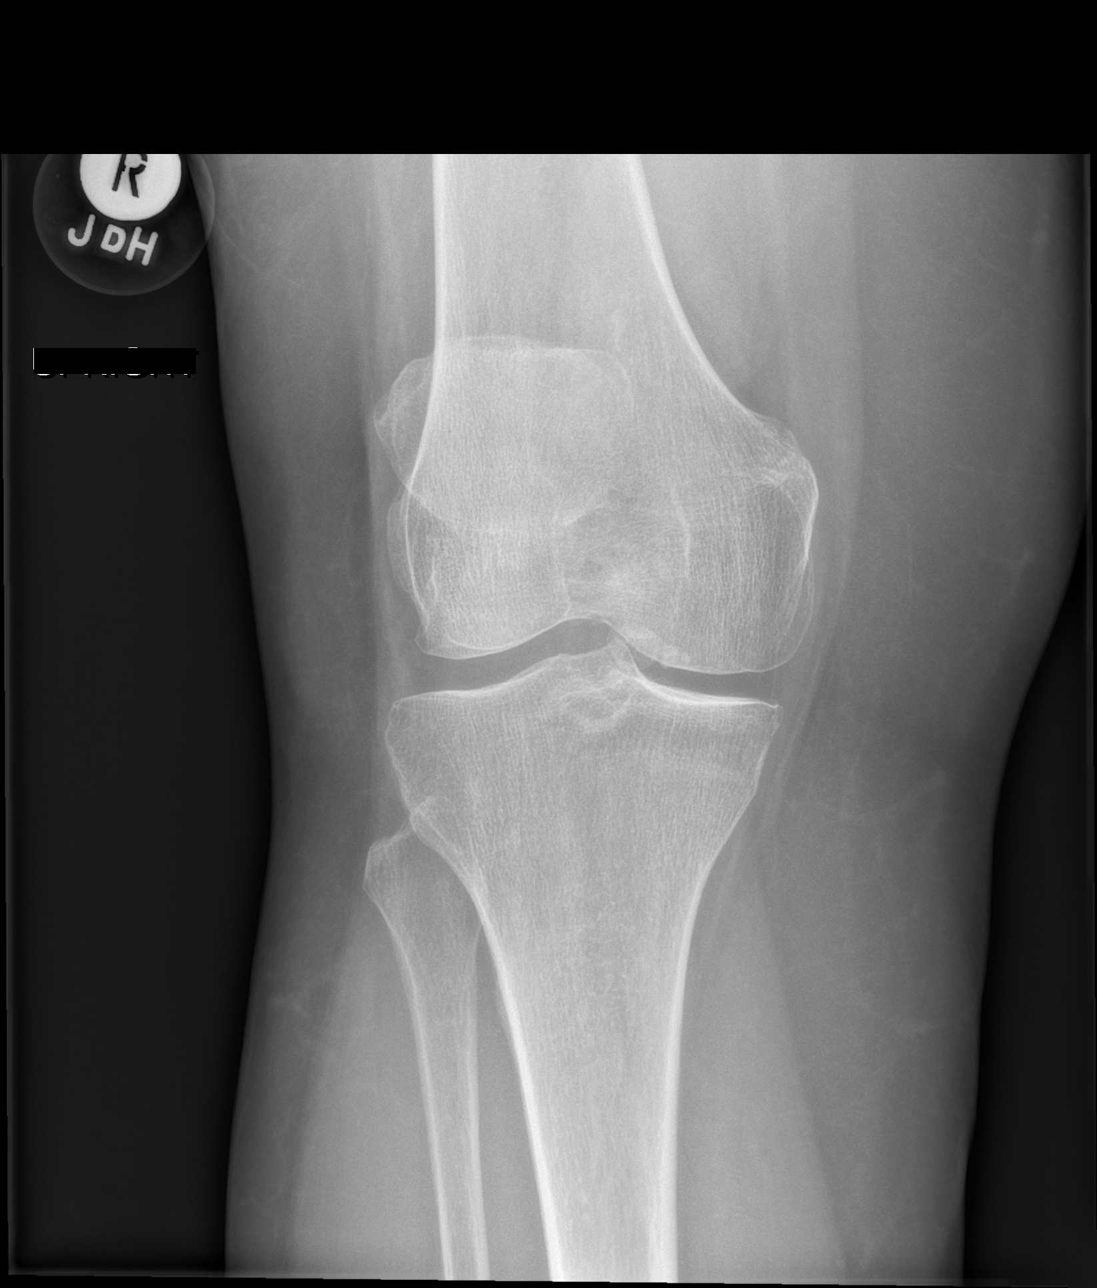

[w knee lat right]
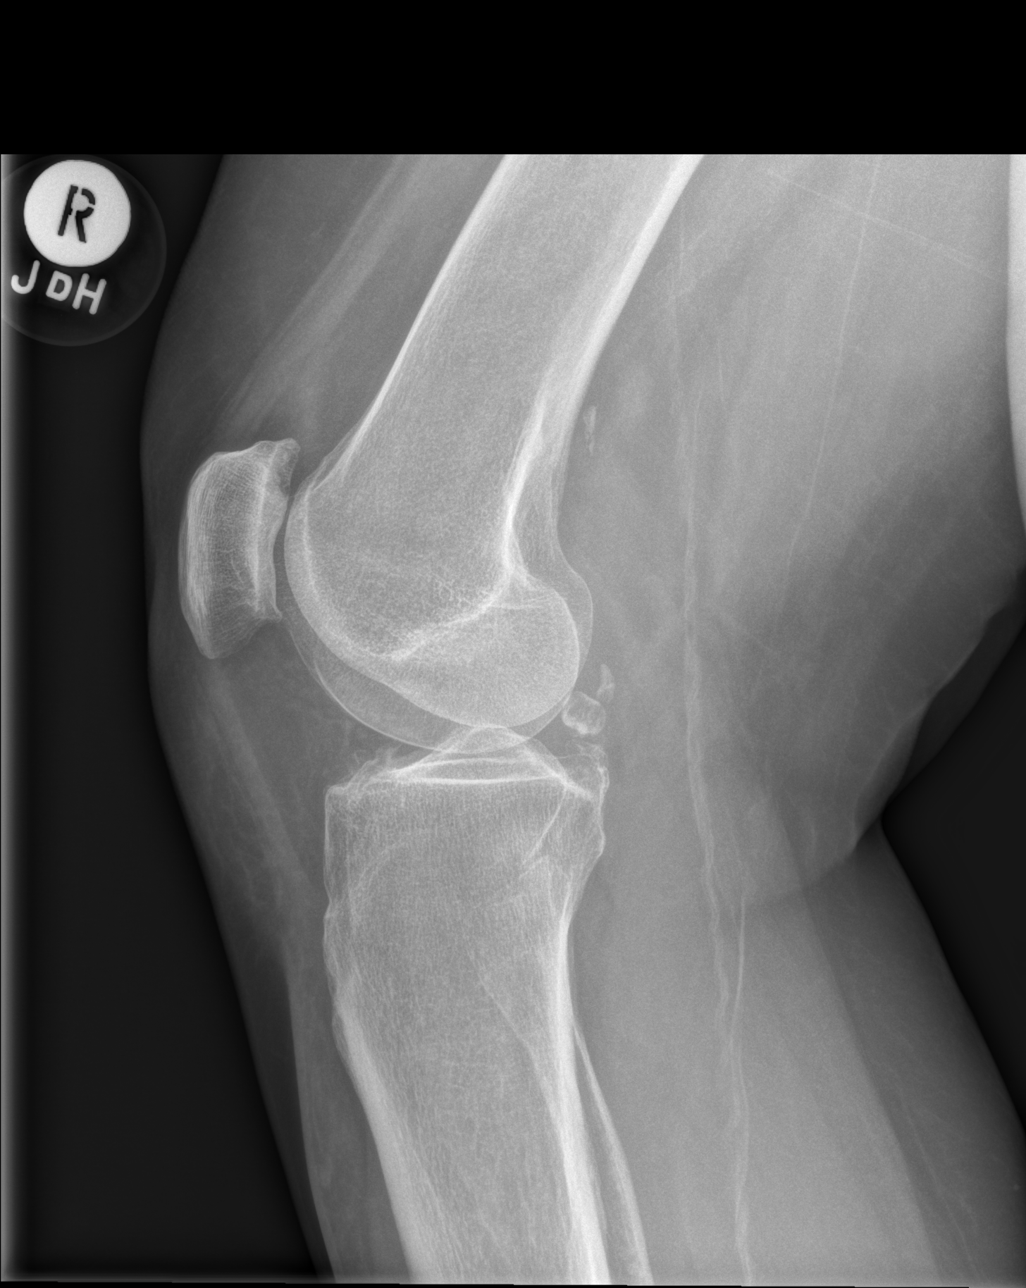

[x knee sunrise right]
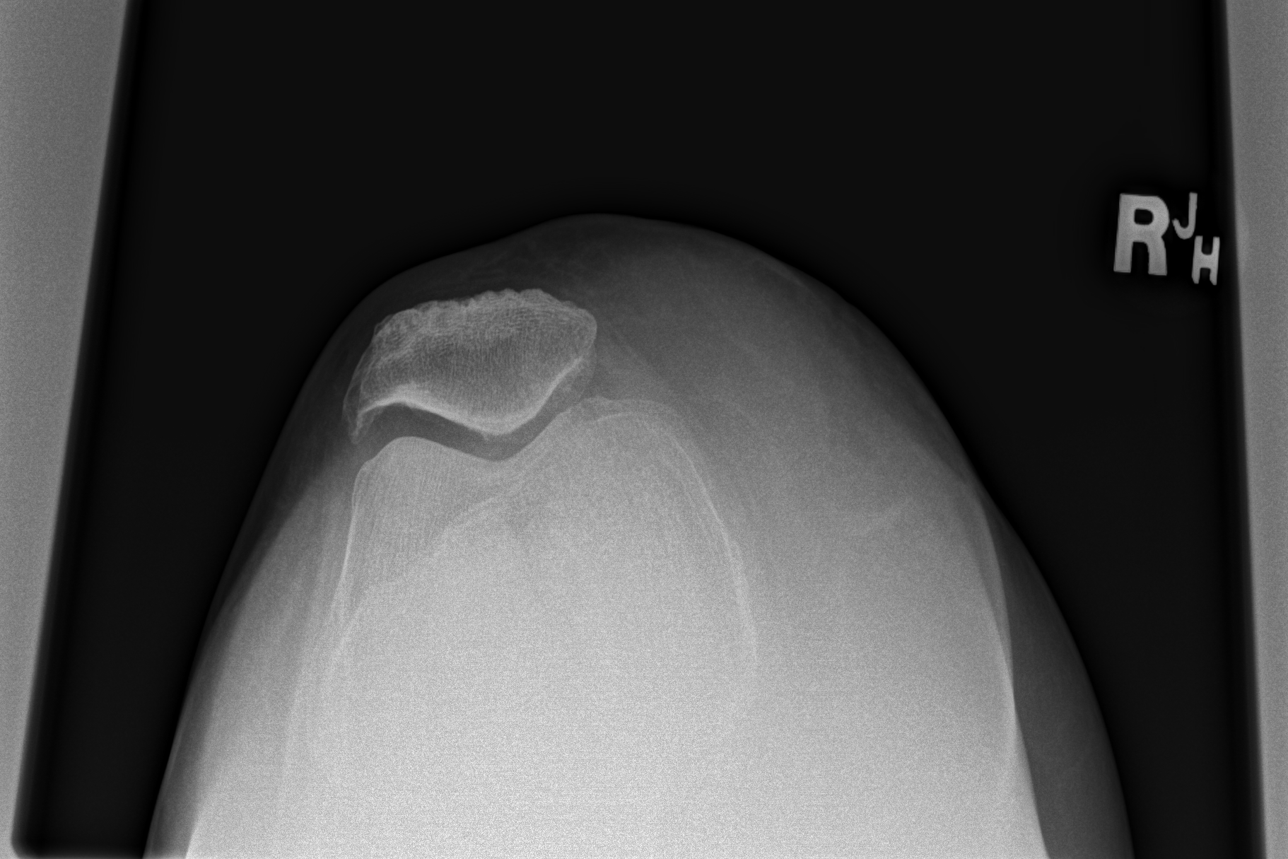

[x knee tunnel right]
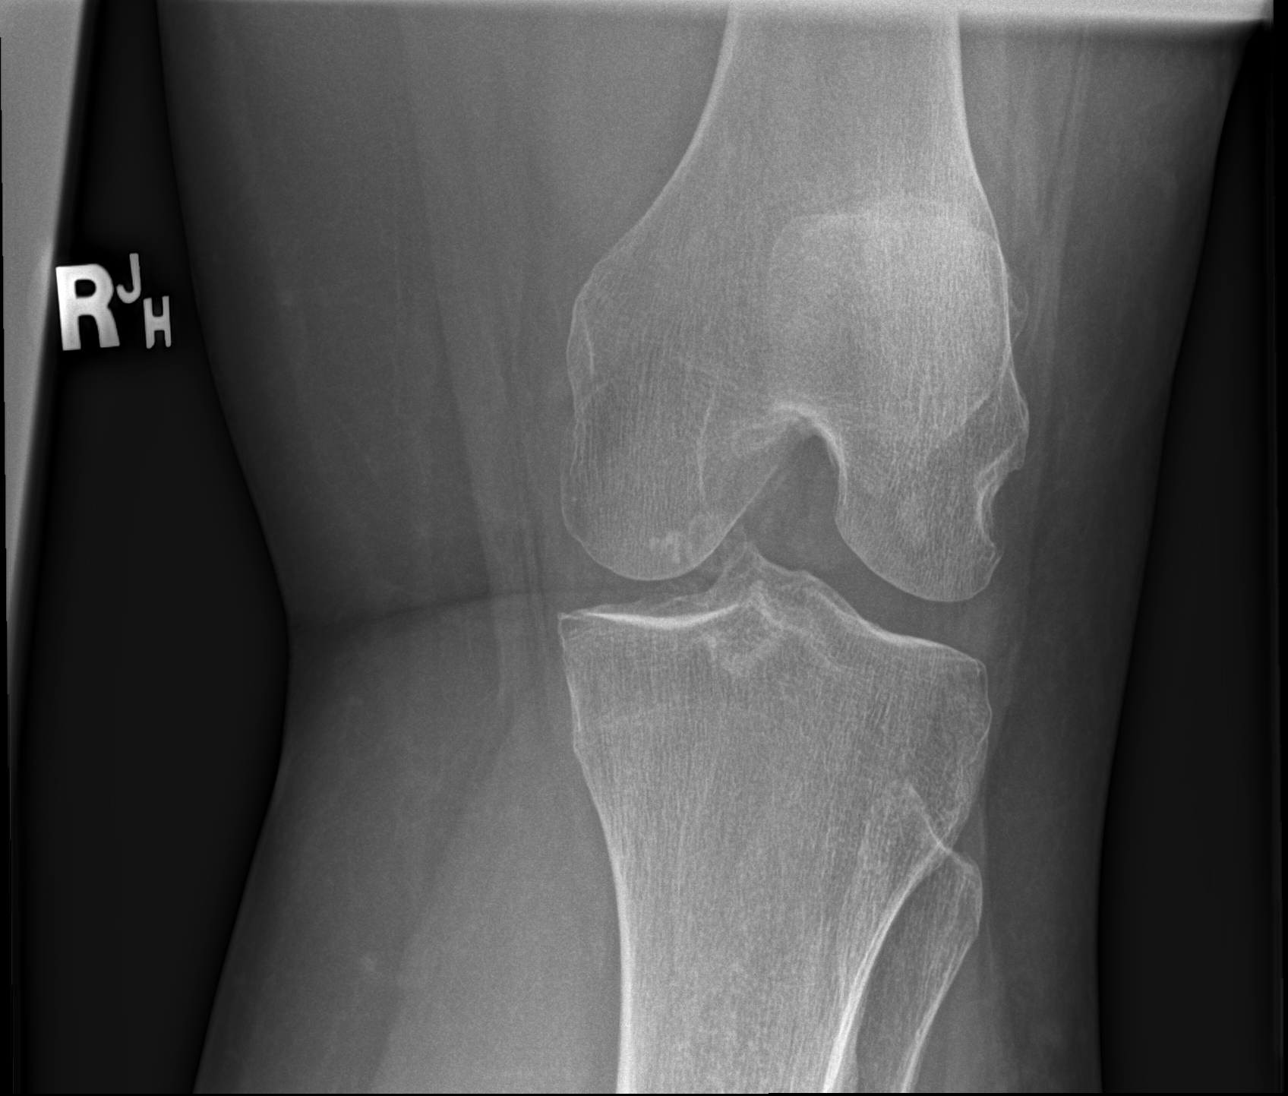

[4 of 4 positions shown; findings below may reference images not displayed]

FINDINGS: There is mild tricompartmental degenerative joint disease the right
knee primarily involving the patellofemoral compartment where there
is more loss of joint space and spurring present. No fracture is
seen. No joint effusion is noted. There do appear to be loose bodies
however within the posterior right knee joint space.
IMPRESSION: 1. Tricompartmental degenerative joint disease primarily involving
the patellofemoral compartment.
2. Loose bodies within the posterior joint space.

## 2018-04-14 ENCOUNTER — Other Ambulatory Visit: Payer: Self-pay | Admitting: Family Medicine

## 2018-04-14 ENCOUNTER — Ambulatory Visit
Admission: RE | Admit: 2018-04-14 | Discharge: 2018-04-14 | Disposition: A | Discharge status: Home / Self Care | Payer: Medicare Other | Source: Ambulatory Visit | Attending: Family Medicine | Admitting: Family Medicine

## 2018-04-14 DIAGNOSIS — R600 Localized edema: Secondary | ICD-10-CM

## 2018-04-28 ENCOUNTER — Ambulatory Visit (INDEPENDENT_AMBULATORY_CARE_PROVIDER_SITE_OTHER): Payer: Medicare Other

## 2018-04-28 DIAGNOSIS — J309 Allergic rhinitis, unspecified: Secondary | ICD-10-CM | POA: Diagnosis not present

## 2018-05-12 ENCOUNTER — Ambulatory Visit (INDEPENDENT_AMBULATORY_CARE_PROVIDER_SITE_OTHER): Payer: Medicare Other

## 2018-05-12 DIAGNOSIS — J309 Allergic rhinitis, unspecified: Secondary | ICD-10-CM

## 2018-05-29 ENCOUNTER — Ambulatory Visit (INDEPENDENT_AMBULATORY_CARE_PROVIDER_SITE_OTHER): Payer: Medicare Other

## 2018-05-29 DIAGNOSIS — J309 Allergic rhinitis, unspecified: Secondary | ICD-10-CM | POA: Diagnosis not present

## 2018-06-09 ENCOUNTER — Ambulatory Visit (INDEPENDENT_AMBULATORY_CARE_PROVIDER_SITE_OTHER): Payer: Medicare Other

## 2018-06-09 DIAGNOSIS — J309 Allergic rhinitis, unspecified: Secondary | ICD-10-CM | POA: Diagnosis not present

## 2018-06-30 ENCOUNTER — Ambulatory Visit (INDEPENDENT_AMBULATORY_CARE_PROVIDER_SITE_OTHER): Payer: Medicare Other

## 2018-06-30 DIAGNOSIS — J309 Allergic rhinitis, unspecified: Secondary | ICD-10-CM

## 2018-07-14 ENCOUNTER — Ambulatory Visit (INDEPENDENT_AMBULATORY_CARE_PROVIDER_SITE_OTHER): Payer: Medicare Other

## 2018-07-14 DIAGNOSIS — J309 Allergic rhinitis, unspecified: Secondary | ICD-10-CM | POA: Diagnosis not present

## 2018-07-21 ENCOUNTER — Other Ambulatory Visit: Payer: Self-pay

## 2018-07-21 ENCOUNTER — Ambulatory Visit: Payer: Medicare Other | Admitting: Pediatrics

## 2018-07-21 ENCOUNTER — Encounter: Payer: Self-pay | Admitting: Pediatrics

## 2018-07-21 VITALS — BP 108/66 | HR 86 | Temp 98.2°F | Resp 16 | Ht 64.5 in | Wt 172.0 lb

## 2018-07-21 DIAGNOSIS — J3089 Other allergic rhinitis: Secondary | ICD-10-CM | POA: Diagnosis not present

## 2018-07-21 DIAGNOSIS — J4520 Mild intermittent asthma, uncomplicated: Secondary | ICD-10-CM | POA: Diagnosis not present

## 2018-07-21 MED ORDER — ALBUTEROL SULFATE HFA 108 (90 BASE) MCG/ACT IN AERS: INHALATION_SPRAY | RESPIRATORY_TRACT | 1 refills | Status: DC

## 2018-07-21 MED ORDER — FLUTICASONE PROPIONATE 50 MCG/ACT NA SUSP: NASAL | 5 refills | Status: DC

## 2018-07-21 NOTE — Progress Notes (Signed)
[place] [address] Dept: [phone]  FOLLOW UP NOTE  Patient ID: Rebecca Murray, female    DOB: [birth year]  Age: 73 y.o. MRN: [id number] Date of Office Visit: 07/21/2018  Assessment  Chief Complaint: Asthma  HPI Rebecca Murray presents for follow-up of asthma and allergic rhinitis.  Her asthma is well controlled and she rarely has to use Proventil.  She is on allergy injections every 4 weeks and is doing well.  She is on fexofenadine 180 mg once a day and fluticasone 1 spray per nostril twice a day.  Her other medications are outlined in the chart   Drug Allergies:  Allergies  Allergen Reactions  . Dextromethorphan Nausea Only    Physical Exam: BP 108/66   Pulse 86   Temp 98.2 F (36.8 C) (Oral)   Resp 16   Ht 5' 4.5" (1.638 m)   Wt 172 lb (78 kg)   SpO2 95%   BMI 29.07 kg/m    Physical Exam Constitutional:      Appearance: Normal appearance. She is normal weight.  HENT:     Head:     Comments: Eyes normal.  Ears normal.  Nose normal.  Pharynx normal. Neck:     Musculoskeletal: Neck supple.  Cardiovascular:     Rate and Rhythm: Normal rate and regular rhythm.     Comments: S1-S2 normal no murmurs Pulmonary:     Comments: Clear to percussion and auscultation Lymphadenopathy:     Cervical: No cervical adenopathy.  Neurological:     General: No focal deficit present.     Mental Status: She is alert and oriented to person, place, and time.  Psychiatric:        Mood and Affect: Mood normal.        Behavior: Behavior normal.        Thought Content: Thought content normal.        Judgment: Judgment normal.     Diagnostics: FVC 2.72 L FEV1 2.22 L.  Predicted FVC 3.04 L predicted FEV1 2.30 L- the spirometry is in the normal range  Assessment and Plan: 1. Mild intermittent asthma without complication   2. Other allergic rhinitis     Meds ordered this encounter  Medications  . albuterol (PROVENTIL HFA) 108 (90 Base) MCG/ACT  inhaler    Sig: 2 puffs every 4 hours as needed for coughing or wheezing    Dispense:  1 Inhaler    Refill:  1  . fluticasone (FLONASE) 50 MCG/ACT nasal spray    Sig: One spray in each nostril twice a day for stuffy nose    Dispense:  16 g    Refill:  5    For stuffy nose or drainage    Patient Instructions  Fexofenadine 180 mg-take 1 tablet once a day if needed for runny nose Fluticasone 1 spray per nostril twice a day if needed for stuffy nose.  You may use nasal saline spray before fluticasone  Allergy injections every 4 weeks  Proventil 2 puffs every 4 hours if needed for wheezing or coughing spells.  You may use Proventil 2 puffs 5 to 15 minutes before exercise Continue on your other medications Call us if you are not doing well on this treatment plan   Return in about 1 year (around 07/21/2019).    Thank you for the opportunity to care for this patient.  Please do not hesitate to contact me with questions.  Penne Lash, M.D.  [facility] [address] [place], [place] [address] [phone]

## 2018-07-21 NOTE — Patient Instructions (Signed)
Fexofenadine 180 mg-take 1 tablet once a day if needed for runny nose Fluticasone 1 spray per nostril twice a day if needed for stuffy nose.  You may use nasal saline spray before fluticasone  Allergy injections every 4 weeks  Proventil 2 puffs every 4 hours if needed for wheezing or coughing spells.  You may use Proventil 2 puffs 5 to 15 minutes before exercise Continue on your other medications Call us if you are not doing well on this treatment plan

## 2018-07-30 ENCOUNTER — Ambulatory Visit (INDEPENDENT_AMBULATORY_CARE_PROVIDER_SITE_OTHER): Payer: Medicare Other

## 2018-07-30 DIAGNOSIS — J309 Allergic rhinitis, unspecified: Secondary | ICD-10-CM

## 2018-08-06 ENCOUNTER — Other Ambulatory Visit: Payer: Self-pay | Admitting: Family Medicine

## 2018-08-06 ENCOUNTER — Ambulatory Visit
Admission: RE | Admit: 2018-08-06 | Discharge: 2018-08-06 | Disposition: A | Discharge status: Home / Self Care | Payer: Medicare Other | Source: Ambulatory Visit | Attending: Family Medicine | Admitting: Family Medicine

## 2018-08-06 ENCOUNTER — Other Ambulatory Visit: Payer: Self-pay

## 2018-08-06 DIAGNOSIS — T1490XA Injury, unspecified, initial encounter: Secondary | ICD-10-CM

## 2018-08-25 ENCOUNTER — Ambulatory Visit (INDEPENDENT_AMBULATORY_CARE_PROVIDER_SITE_OTHER): Payer: Medicare Other

## 2018-08-25 DIAGNOSIS — J309 Allergic rhinitis, unspecified: Secondary | ICD-10-CM

## 2018-09-03 NOTE — Progress Notes (Signed)
EXP 09/04/19 

## 2018-09-09 DIAGNOSIS — J301 Allergic rhinitis due to pollen: Secondary | ICD-10-CM | POA: Diagnosis not present

## 2018-09-22 ENCOUNTER — Ambulatory Visit (INDEPENDENT_AMBULATORY_CARE_PROVIDER_SITE_OTHER): Payer: Medicare Other

## 2018-09-22 DIAGNOSIS — J309 Allergic rhinitis, unspecified: Secondary | ICD-10-CM

## 2018-10-21 ENCOUNTER — Ambulatory Visit (INDEPENDENT_AMBULATORY_CARE_PROVIDER_SITE_OTHER): Payer: Medicare Other

## 2018-10-21 DIAGNOSIS — J309 Allergic rhinitis, unspecified: Secondary | ICD-10-CM

## 2018-11-17 ENCOUNTER — Ambulatory Visit (INDEPENDENT_AMBULATORY_CARE_PROVIDER_SITE_OTHER): Payer: Medicare Other

## 2018-11-17 DIAGNOSIS — J309 Allergic rhinitis, unspecified: Secondary | ICD-10-CM

## 2018-12-15 ENCOUNTER — Ambulatory Visit (INDEPENDENT_AMBULATORY_CARE_PROVIDER_SITE_OTHER): Payer: Medicare Other

## 2018-12-15 DIAGNOSIS — J309 Allergic rhinitis, unspecified: Secondary | ICD-10-CM | POA: Diagnosis not present

## 2018-12-23 ENCOUNTER — Encounter: Payer: Self-pay | Admitting: Internal Medicine

## 2019-01-12 ENCOUNTER — Ambulatory Visit (INDEPENDENT_AMBULATORY_CARE_PROVIDER_SITE_OTHER): Payer: Medicare Other

## 2019-01-12 DIAGNOSIS — J309 Allergic rhinitis, unspecified: Secondary | ICD-10-CM

## 2019-01-12 DIAGNOSIS — K219 Gastro-esophageal reflux disease without esophagitis: Secondary | ICD-10-CM | POA: Insufficient documentation

## 2019-01-12 DIAGNOSIS — T7840XA Allergy, unspecified, initial encounter: Secondary | ICD-10-CM | POA: Insufficient documentation

## 2019-01-12 DIAGNOSIS — J45909 Unspecified asthma, uncomplicated: Secondary | ICD-10-CM | POA: Insufficient documentation

## 2019-01-20 ENCOUNTER — Ambulatory Visit (AMBULATORY_SURGERY_CENTER): Payer: Medicare Other

## 2019-01-20 ENCOUNTER — Encounter: Payer: Self-pay | Admitting: Internal Medicine

## 2019-01-20 ENCOUNTER — Other Ambulatory Visit: Payer: Self-pay

## 2019-01-20 VITALS — Temp 96.9°F | Ht 66.0 in | Wt 172.4 lb

## 2019-01-20 DIAGNOSIS — Z1211 Encounter for screening for malignant neoplasm of colon: Secondary | ICD-10-CM

## 2019-01-20 DIAGNOSIS — Z1159 Encounter for screening for other viral diseases: Secondary | ICD-10-CM

## 2019-01-20 MED ORDER — NA SULFATE-K SULFATE-MG SULF 17.5-3.13-1.6 GM/177ML PO SOLN: 1.0000 | Freq: Once | ORAL | 0 refills | Status: AC

## 2019-01-20 NOTE — Progress Notes (Signed)

## 2019-01-26 ENCOUNTER — Ambulatory Visit (INDEPENDENT_AMBULATORY_CARE_PROVIDER_SITE_OTHER): Payer: Medicare Other

## 2019-01-26 DIAGNOSIS — J309 Allergic rhinitis, unspecified: Secondary | ICD-10-CM | POA: Diagnosis not present

## 2019-01-30 ENCOUNTER — Encounter: Payer: Medicare Other | Admitting: Internal Medicine

## 2019-02-09 ENCOUNTER — Ambulatory Visit (INDEPENDENT_AMBULATORY_CARE_PROVIDER_SITE_OTHER): Payer: Medicare Other

## 2019-02-09 DIAGNOSIS — J309 Allergic rhinitis, unspecified: Secondary | ICD-10-CM | POA: Diagnosis not present

## 2019-02-24 ENCOUNTER — Ambulatory Visit (INDEPENDENT_AMBULATORY_CARE_PROVIDER_SITE_OTHER): Payer: Medicare PPO

## 2019-02-24 DIAGNOSIS — J309 Allergic rhinitis, unspecified: Secondary | ICD-10-CM

## 2019-03-06 ENCOUNTER — Ambulatory Visit: Payer: Medicare PPO | Attending: Internal Medicine

## 2019-03-06 DIAGNOSIS — Z23 Encounter for immunization: Secondary | ICD-10-CM | POA: Insufficient documentation

## 2019-03-06 NOTE — Progress Notes (Signed)
   Covid-19 Vaccination Clinic  Name:  Rebecca Murray    MRN: [id number] DOB: [birth year]  03/06/2019  Rebecca Murray was observed post Covid-19 immunization for 15 minutes without incidence. She was provided with Vaccine Information Sheet and instruction to access the V-Safe system.   Rebecca Murray was instructed to call 911 with any severe reactions post vaccine: Marland Kitchen Difficulty breathing  . Swelling of your face and throat  . A fast heartbeat  . A bad rash all over your body  . Dizziness and weakness    Immunizations Administered    Name Date Dose VIS Date Route   Pfizer COVID-19 Vaccine 03/06/2019  1:16 PM 0.3 mL 01/23/2019 Intramuscular   Manufacturer: [employer], [employer]   Lot: [id number]   NDC: [address]-4

## 2019-03-12 ENCOUNTER — Ambulatory Visit (INDEPENDENT_AMBULATORY_CARE_PROVIDER_SITE_OTHER): Payer: Medicare PPO

## 2019-03-12 DIAGNOSIS — J309 Allergic rhinitis, unspecified: Secondary | ICD-10-CM

## 2019-03-14 ENCOUNTER — Other Ambulatory Visit: Payer: Self-pay | Admitting: Pediatrics

## 2019-03-25 ENCOUNTER — Ambulatory Visit: Payer: Medicare PPO | Attending: Internal Medicine

## 2019-03-25 DIAGNOSIS — Z23 Encounter for immunization: Secondary | ICD-10-CM

## 2019-03-25 NOTE — Progress Notes (Signed)
   Covid-19 Vaccination Clinic  Name:  Rebecca Murray    MRN: [id number] DOB: February 24, 1945  03/25/2019  [name] was observed post Covid-19 immunization for 15 minutes without incidence. She was provided with Vaccine Information Sheet and instruction to access the V-Safe system.   [name] was instructed to call 911 with any severe reactions post vaccine: Marland Kitchen Difficulty breathing  . Swelling of your face and throat  . A fast heartbeat  . A bad rash all over your body  . Dizziness and weakness    Immunizations Administered    Name Date Dose VIS Date Route   Pfizer COVID-19 Vaccine 03/25/2019 12:26 PM 0.3 mL 01/23/2019 Intramuscular   Manufacturer: [employer], [employer]   Lot: [id number]   NDC: [address]-9

## 2019-04-06 ENCOUNTER — Ambulatory Visit (INDEPENDENT_AMBULATORY_CARE_PROVIDER_SITE_OTHER): Payer: Medicare PPO

## 2019-04-06 DIAGNOSIS — J309 Allergic rhinitis, unspecified: Secondary | ICD-10-CM

## 2019-04-22 DIAGNOSIS — E782 Mixed hyperlipidemia: Secondary | ICD-10-CM | POA: Diagnosis not present

## 2019-04-22 DIAGNOSIS — I10 Essential (primary) hypertension: Secondary | ICD-10-CM | POA: Diagnosis not present

## 2019-04-22 DIAGNOSIS — E559 Vitamin D deficiency, unspecified: Secondary | ICD-10-CM | POA: Diagnosis not present

## 2019-04-22 DIAGNOSIS — E039 Hypothyroidism, unspecified: Secondary | ICD-10-CM | POA: Diagnosis not present

## 2019-04-27 DIAGNOSIS — F411 Generalized anxiety disorder: Secondary | ICD-10-CM | POA: Diagnosis not present

## 2019-04-27 DIAGNOSIS — F331 Major depressive disorder, recurrent, moderate: Secondary | ICD-10-CM | POA: Diagnosis not present

## 2019-04-27 DIAGNOSIS — E039 Hypothyroidism, unspecified: Secondary | ICD-10-CM | POA: Diagnosis not present

## 2019-04-27 DIAGNOSIS — E782 Mixed hyperlipidemia: Secondary | ICD-10-CM | POA: Diagnosis not present

## 2019-05-04 ENCOUNTER — Ambulatory Visit (INDEPENDENT_AMBULATORY_CARE_PROVIDER_SITE_OTHER): Payer: Medicare PPO

## 2019-05-04 DIAGNOSIS — J309 Allergic rhinitis, unspecified: Secondary | ICD-10-CM | POA: Diagnosis not present

## 2019-05-12 NOTE — Progress Notes (Signed)
VIAL EXP 05-11-20 

## 2019-05-13 DIAGNOSIS — J301 Allergic rhinitis due to pollen: Secondary | ICD-10-CM | POA: Diagnosis not present

## 2019-06-01 ENCOUNTER — Ambulatory Visit (INDEPENDENT_AMBULATORY_CARE_PROVIDER_SITE_OTHER): Payer: Medicare PPO

## 2019-06-01 DIAGNOSIS — J309 Allergic rhinitis, unspecified: Secondary | ICD-10-CM

## 2019-06-05 DIAGNOSIS — T148XXA Other injury of unspecified body region, initial encounter: Secondary | ICD-10-CM | POA: Diagnosis not present

## 2019-06-22 DIAGNOSIS — T148XXA Other injury of unspecified body region, initial encounter: Secondary | ICD-10-CM | POA: Diagnosis not present

## 2019-06-22 DIAGNOSIS — F411 Generalized anxiety disorder: Secondary | ICD-10-CM | POA: Diagnosis not present

## 2019-06-22 DIAGNOSIS — F331 Major depressive disorder, recurrent, moderate: Secondary | ICD-10-CM | POA: Diagnosis not present

## 2019-06-29 ENCOUNTER — Ambulatory Visit (INDEPENDENT_AMBULATORY_CARE_PROVIDER_SITE_OTHER): Payer: Medicare PPO

## 2019-06-29 DIAGNOSIS — J309 Allergic rhinitis, unspecified: Secondary | ICD-10-CM | POA: Diagnosis not present

## 2019-07-02 DIAGNOSIS — M170 Bilateral primary osteoarthritis of knee: Secondary | ICD-10-CM | POA: Diagnosis not present

## 2019-07-02 DIAGNOSIS — M549 Dorsalgia, unspecified: Secondary | ICD-10-CM | POA: Diagnosis not present

## 2019-07-06 ENCOUNTER — Other Ambulatory Visit: Payer: Self-pay | Admitting: Pediatrics

## 2019-07-14 ENCOUNTER — Encounter: Payer: Self-pay | Admitting: Pediatrics

## 2019-07-14 ENCOUNTER — Other Ambulatory Visit: Payer: Self-pay

## 2019-07-14 ENCOUNTER — Ambulatory Visit (INDEPENDENT_AMBULATORY_CARE_PROVIDER_SITE_OTHER): Payer: Medicare PPO | Admitting: Pediatrics

## 2019-07-14 VITALS — BP 150/84 | HR 78 | Temp 98.1°F | Resp 16 | Ht 63.0 in | Wt 169.2 lb

## 2019-07-14 DIAGNOSIS — J302 Other seasonal allergic rhinitis: Secondary | ICD-10-CM | POA: Diagnosis not present

## 2019-07-14 DIAGNOSIS — I10 Essential (primary) hypertension: Secondary | ICD-10-CM | POA: Diagnosis not present

## 2019-07-14 DIAGNOSIS — J3089 Other allergic rhinitis: Secondary | ICD-10-CM | POA: Diagnosis not present

## 2019-07-14 DIAGNOSIS — J4520 Mild intermittent asthma, uncomplicated: Secondary | ICD-10-CM | POA: Diagnosis not present

## 2019-07-14 MED ORDER — EPINEPHRINE 0.3 MG/0.3ML IJ SOAJ
INTRAMUSCULAR | 2 refills | Status: DC
Start: 2019-07-14 — End: 2021-07-13

## 2019-07-14 NOTE — Progress Notes (Signed)
[place] [address] Dept: [phone]  FOLLOW UP NOTE  Patient ID: Rebecca Murray, female    DOB: Sep 05, 1945  Age: 74 y.o. MRN: [id number] Date of Office Visit: 07/14/2019  Assessment  Chief Complaint: Asthma  HPI Rebecca Murray is a 74 year old female who presents to the clinic for a follow up visit. She was last seen in the clinic on 07/21/2018 by Dr. Shaune Leeks for evaluation of asthma and allergic rhinitis on allergen immunotherapy.  At today's visit she reports her asthma has been well controlled with no shortness of breath, cough, or wheeze with activity or rest.  She uses her albuterol once every several months with relief of symptoms.  Allergic rhinitis is reported as moderately well controlled with clear rhinorrhea and occasional postnasal drainage.  She reports occasional sneezing when spending a lot of time outside.  She continues Allegra 180 mg once a day, Flonase and saline rinses daily.  She continues allergen immunotherapy once every 4 weeks with no large local reactions.  She reports allergen immunotherapy has significantly reduced her symptoms of allergic rhinitis.  Her current medications are listed in the chart.   Drug Allergies:  Allergies  Allergen Reactions  . Dextromethorphan Nausea Only  . Rosuvastatin     Muscle aches  . Sertraline     migraine    Physical Exam: BP (!) 150/84   Pulse 78   Temp 98.1 F (36.7 C) (Oral)   Resp 16   Ht 5\' 3"  (1.6 m)   Wt 169 lb 3.2 oz (76.7 kg)   SpO2 99%   BMI 29.97 kg/m    Physical Exam Vitals reviewed.  Constitutional:      Appearance: Normal appearance.  HENT:     Head: Normocephalic and atraumatic.     Right Ear: Tympanic membrane normal.     Left Ear: Tympanic membrane normal.     Nose:     Comments: Bilateral nares normal.  Pharynx normal.  Ears normal.  Eyes normal.    Mouth/Throat:     Pharynx: Oropharynx is clear.  Eyes:     Conjunctiva/sclera: Conjunctivae normal.    Cardiovascular:     Rate and Rhythm: Normal rate and regular rhythm.     Heart sounds: Normal heart sounds. No murmur.  Pulmonary:     Effort: Pulmonary effort is normal.     Breath sounds: Normal breath sounds.     Comments: Lungs clear to auscultation Musculoskeletal:        General: Normal range of motion.     Cervical back: Normal range of motion and neck supple.  Skin:    General: Skin is warm and dry.  Neurological:     Mental Status: She is alert and oriented to person, place, and time.  Psychiatric:        Mood and Affect: Mood normal.        Behavior: Behavior normal.        Thought Content: Thought content normal.        Judgment: Judgment normal.     Diagnostics: FVC 2.43, FEV1 2.12.  Predicted FVC 2.77, predicted FEV1 2.08.  Spirometry indicates normal ventilatory function.  Assessment and Plan: 1. Mild intermittent asthma without complication   2. Essential hypertension   3. Seasonal and perennial allergic rhinitis     Meds ordered this encounter  Medications  . EPINEPHrine (EPIPEN 2-PAK) 0.3 mg/0.3 mL IJ SOAJ injection    Sig: Use as directed for severe  allergic reaction    Dispense:  2 each    Refill:  2    Dispense mylan generic brand only    Patient Instructions  Allergic rhinitis Continue Allegra 180 mg once a day as needed for runny nose Continue fluticasone 1 spray per nostril twice a day if needed for stuffy nose Continue nasal saline spray as needed. Use this before medicated nasal spray for best results Continue allergy injections once every 4 weeks  Asthma Proventil 2 puffs every 4 hours if needed for wheezing or coughing spells  Your blood pressure was elevated during the visit today. Follow up with your primary care provider for evaluation of your blood pressure.   Continue the other medications as listed in your chart  Call me if you're not doing better on this treatment plan  Follow up in 1 year or sooner   Return in about 1  year (around 07/13/2020), or if symptoms worsen or fail to improve.   Thank you for the opportunity to care for this patient.  Please do not hesitate to contact me with questions.  Thermon Leyland, FNP [facility]  I have provided oversight concerning Thermon Leyland' evaluation and treatment of this patient's health issues addressed during today's encounter. I agree with the assessment and therapeutic plan as outlined in the note.   Thank you for the opportunity to care for this patient.  Please do not hesitate to contact me with questions.  Tonette Bihari, M.D.  [facility] [address] [place], [place] [address] [phone]

## 2019-07-14 NOTE — Patient Instructions (Addendum)
Allergic rhinitis Continue Allegra 180 mg once a day as needed for runny nose Continue fluticasone 1 spray per nostril twice a day if needed for stuffy nose Continue nasal saline spray as needed. Use this before medicated nasal spray for best results Continue allergy injections once every 4 weeks  Asthma Proventil 2 puffs every 4 hours if needed for wheezing or coughing spells  Your blood pressure was elevated during the visit today. Follow up with your primary care provider for evaluation of your blood pressure.   Continue the other medications as listed in your chart  Call me if you're not doing better on this treatment plan  Follow up in 1 year or sooner

## 2019-07-21 ENCOUNTER — Ambulatory Visit: Payer: Medicare Other | Admitting: Pediatrics

## 2019-07-27 ENCOUNTER — Ambulatory Visit (INDEPENDENT_AMBULATORY_CARE_PROVIDER_SITE_OTHER): Payer: Medicare PPO

## 2019-07-27 DIAGNOSIS — J4520 Mild intermittent asthma, uncomplicated: Secondary | ICD-10-CM | POA: Diagnosis not present

## 2019-08-18 ENCOUNTER — Other Ambulatory Visit: Payer: Self-pay

## 2019-08-18 MED ORDER — FLUTICASONE PROPIONATE 50 MCG/ACT NA SUSP
NASAL | 5 refills | Status: DC
Start: 2019-08-18 — End: 2020-02-09

## 2019-08-24 ENCOUNTER — Ambulatory Visit (INDEPENDENT_AMBULATORY_CARE_PROVIDER_SITE_OTHER): Payer: Medicare PPO

## 2019-08-24 DIAGNOSIS — J309 Allergic rhinitis, unspecified: Secondary | ICD-10-CM | POA: Diagnosis not present

## 2019-08-25 DIAGNOSIS — E782 Mixed hyperlipidemia: Secondary | ICD-10-CM | POA: Diagnosis not present

## 2019-08-25 DIAGNOSIS — I10 Essential (primary) hypertension: Secondary | ICD-10-CM | POA: Diagnosis not present

## 2019-08-25 DIAGNOSIS — E039 Hypothyroidism, unspecified: Secondary | ICD-10-CM | POA: Diagnosis not present

## 2019-08-25 DIAGNOSIS — E559 Vitamin D deficiency, unspecified: Secondary | ICD-10-CM | POA: Diagnosis not present

## 2019-08-27 DIAGNOSIS — I10 Essential (primary) hypertension: Secondary | ICD-10-CM | POA: Diagnosis not present

## 2019-08-27 DIAGNOSIS — E1169 Type 2 diabetes mellitus with other specified complication: Secondary | ICD-10-CM | POA: Diagnosis not present

## 2019-08-27 DIAGNOSIS — E039 Hypothyroidism, unspecified: Secondary | ICD-10-CM | POA: Diagnosis not present

## 2019-08-27 DIAGNOSIS — E782 Mixed hyperlipidemia: Secondary | ICD-10-CM | POA: Diagnosis not present

## 2019-09-07 ENCOUNTER — Ambulatory Visit (INDEPENDENT_AMBULATORY_CARE_PROVIDER_SITE_OTHER): Payer: Medicare PPO

## 2019-09-07 DIAGNOSIS — J309 Allergic rhinitis, unspecified: Secondary | ICD-10-CM | POA: Diagnosis not present

## 2019-09-21 ENCOUNTER — Ambulatory Visit (INDEPENDENT_AMBULATORY_CARE_PROVIDER_SITE_OTHER): Payer: Medicare PPO

## 2019-09-21 DIAGNOSIS — J309 Allergic rhinitis, unspecified: Secondary | ICD-10-CM | POA: Diagnosis not present

## 2019-10-05 ENCOUNTER — Ambulatory Visit (INDEPENDENT_AMBULATORY_CARE_PROVIDER_SITE_OTHER): Payer: Medicare PPO

## 2019-10-05 DIAGNOSIS — J309 Allergic rhinitis, unspecified: Secondary | ICD-10-CM

## 2019-10-06 DIAGNOSIS — Z23 Encounter for immunization: Secondary | ICD-10-CM | POA: Diagnosis not present

## 2019-10-20 ENCOUNTER — Ambulatory Visit (INDEPENDENT_AMBULATORY_CARE_PROVIDER_SITE_OTHER): Payer: Medicare PPO

## 2019-10-20 DIAGNOSIS — J309 Allergic rhinitis, unspecified: Secondary | ICD-10-CM

## 2019-10-23 DIAGNOSIS — E782 Mixed hyperlipidemia: Secondary | ICD-10-CM | POA: Diagnosis not present

## 2019-10-27 DIAGNOSIS — E782 Mixed hyperlipidemia: Secondary | ICD-10-CM | POA: Diagnosis not present

## 2019-10-27 DIAGNOSIS — F331 Major depressive disorder, recurrent, moderate: Secondary | ICD-10-CM | POA: Diagnosis not present

## 2019-10-27 DIAGNOSIS — G4700 Insomnia, unspecified: Secondary | ICD-10-CM | POA: Diagnosis not present

## 2019-10-27 DIAGNOSIS — I10 Essential (primary) hypertension: Secondary | ICD-10-CM | POA: Diagnosis not present

## 2019-10-27 DIAGNOSIS — F411 Generalized anxiety disorder: Secondary | ICD-10-CM | POA: Diagnosis not present

## 2019-11-16 ENCOUNTER — Ambulatory Visit (INDEPENDENT_AMBULATORY_CARE_PROVIDER_SITE_OTHER): Payer: Medicare PPO | Admitting: *Deleted

## 2019-11-16 DIAGNOSIS — J309 Allergic rhinitis, unspecified: Secondary | ICD-10-CM

## 2019-12-14 ENCOUNTER — Ambulatory Visit (INDEPENDENT_AMBULATORY_CARE_PROVIDER_SITE_OTHER): Payer: Medicare PPO

## 2019-12-14 DIAGNOSIS — J309 Allergic rhinitis, unspecified: Secondary | ICD-10-CM | POA: Diagnosis not present

## 2019-12-18 DIAGNOSIS — E782 Mixed hyperlipidemia: Secondary | ICD-10-CM | POA: Diagnosis not present

## 2019-12-18 DIAGNOSIS — E559 Vitamin D deficiency, unspecified: Secondary | ICD-10-CM | POA: Diagnosis not present

## 2019-12-18 DIAGNOSIS — E039 Hypothyroidism, unspecified: Secondary | ICD-10-CM | POA: Diagnosis not present

## 2019-12-23 DIAGNOSIS — Z0000 Encounter for general adult medical examination without abnormal findings: Secondary | ICD-10-CM | POA: Diagnosis not present

## 2019-12-23 DIAGNOSIS — Z1331 Encounter for screening for depression: Secondary | ICD-10-CM | POA: Diagnosis not present

## 2019-12-23 DIAGNOSIS — Z1339 Encounter for screening examination for other mental health and behavioral disorders: Secondary | ICD-10-CM | POA: Diagnosis not present

## 2019-12-24 DIAGNOSIS — J301 Allergic rhinitis due to pollen: Secondary | ICD-10-CM | POA: Diagnosis not present

## 2019-12-24 NOTE — Progress Notes (Signed)
Vial exp 12-23-20

## 2020-01-03 ENCOUNTER — Other Ambulatory Visit: Payer: Self-pay | Admitting: Pediatrics

## 2020-01-04 DIAGNOSIS — H5213 Myopia, bilateral: Secondary | ICD-10-CM | POA: Diagnosis not present

## 2020-01-04 DIAGNOSIS — H25013 Cortical age-related cataract, bilateral: Secondary | ICD-10-CM | POA: Diagnosis not present

## 2020-01-04 DIAGNOSIS — H04123 Dry eye syndrome of bilateral lacrimal glands: Secondary | ICD-10-CM | POA: Diagnosis not present

## 2020-01-04 DIAGNOSIS — H2513 Age-related nuclear cataract, bilateral: Secondary | ICD-10-CM | POA: Diagnosis not present

## 2020-01-04 DIAGNOSIS — H524 Presbyopia: Secondary | ICD-10-CM | POA: Diagnosis not present

## 2020-01-04 DIAGNOSIS — H52203 Unspecified astigmatism, bilateral: Secondary | ICD-10-CM | POA: Diagnosis not present

## 2020-01-04 DIAGNOSIS — H40013 Open angle with borderline findings, low risk, bilateral: Secondary | ICD-10-CM | POA: Diagnosis not present

## 2020-01-05 DIAGNOSIS — J45901 Unspecified asthma with (acute) exacerbation: Secondary | ICD-10-CM | POA: Diagnosis not present

## 2020-01-05 DIAGNOSIS — J019 Acute sinusitis, unspecified: Secondary | ICD-10-CM | POA: Diagnosis not present

## 2020-01-05 DIAGNOSIS — J309 Allergic rhinitis, unspecified: Secondary | ICD-10-CM | POA: Diagnosis not present

## 2020-01-12 ENCOUNTER — Ambulatory Visit (INDEPENDENT_AMBULATORY_CARE_PROVIDER_SITE_OTHER): Payer: Medicare PPO

## 2020-01-12 DIAGNOSIS — J309 Allergic rhinitis, unspecified: Secondary | ICD-10-CM

## 2020-01-20 DIAGNOSIS — Z1211 Encounter for screening for malignant neoplasm of colon: Secondary | ICD-10-CM | POA: Diagnosis not present

## 2020-01-21 DIAGNOSIS — Z1211 Encounter for screening for malignant neoplasm of colon: Secondary | ICD-10-CM | POA: Diagnosis not present

## 2020-01-21 DIAGNOSIS — M170 Bilateral primary osteoarthritis of knee: Secondary | ICD-10-CM | POA: Diagnosis not present

## 2020-02-08 ENCOUNTER — Other Ambulatory Visit: Payer: Self-pay | Admitting: Family Medicine

## 2020-02-09 ENCOUNTER — Ambulatory Visit (INDEPENDENT_AMBULATORY_CARE_PROVIDER_SITE_OTHER): Payer: Medicare PPO

## 2020-02-09 DIAGNOSIS — J309 Allergic rhinitis, unspecified: Secondary | ICD-10-CM | POA: Diagnosis not present

## 2020-02-26 ENCOUNTER — Other Ambulatory Visit: Payer: Self-pay | Admitting: Family Medicine

## 2020-03-04 DIAGNOSIS — K469 Unspecified abdominal hernia without obstruction or gangrene: Secondary | ICD-10-CM | POA: Diagnosis not present

## 2020-03-08 ENCOUNTER — Ambulatory Visit (INDEPENDENT_AMBULATORY_CARE_PROVIDER_SITE_OTHER): Payer: Medicare PPO

## 2020-03-08 DIAGNOSIS — J309 Allergic rhinitis, unspecified: Secondary | ICD-10-CM

## 2020-03-22 ENCOUNTER — Ambulatory Visit (INDEPENDENT_AMBULATORY_CARE_PROVIDER_SITE_OTHER): Payer: Medicare PPO

## 2020-03-22 DIAGNOSIS — J309 Allergic rhinitis, unspecified: Secondary | ICD-10-CM | POA: Diagnosis not present

## 2020-04-04 ENCOUNTER — Ambulatory Visit (INDEPENDENT_AMBULATORY_CARE_PROVIDER_SITE_OTHER): Payer: Medicare PPO

## 2020-04-04 DIAGNOSIS — J309 Allergic rhinitis, unspecified: Secondary | ICD-10-CM | POA: Diagnosis not present

## 2020-04-18 ENCOUNTER — Ambulatory Visit (INDEPENDENT_AMBULATORY_CARE_PROVIDER_SITE_OTHER): Payer: Medicare PPO

## 2020-04-18 DIAGNOSIS — J309 Allergic rhinitis, unspecified: Secondary | ICD-10-CM | POA: Diagnosis not present

## 2020-05-02 ENCOUNTER — Ambulatory Visit (INDEPENDENT_AMBULATORY_CARE_PROVIDER_SITE_OTHER): Payer: Medicare PPO

## 2020-05-02 DIAGNOSIS — J309 Allergic rhinitis, unspecified: Secondary | ICD-10-CM

## 2020-05-16 ENCOUNTER — Ambulatory Visit (INDEPENDENT_AMBULATORY_CARE_PROVIDER_SITE_OTHER): Payer: Medicare PPO

## 2020-05-16 DIAGNOSIS — J309 Allergic rhinitis, unspecified: Secondary | ICD-10-CM

## 2020-06-07 DIAGNOSIS — Z23 Encounter for immunization: Secondary | ICD-10-CM | POA: Diagnosis not present

## 2020-06-10 IMAGING — US US EXTREM LOW VENOUS
1 series · 14 of 24 positions shown · non-contrast
Comparison: None

CLINICAL DATA: Lower leg edema, severe, x5 days

EXAM:
BILATERAL LOWER EXTREMITY VENOUS DOPPLER ULTRASOUND
TECHNIQUE: Gray-scale sonography with compression, as well as color and duplex
ultrasound, were performed to evaluate the deep venous system from
the level of the common femoral vein through the popliteal and
proximal calf veins.

[Series 1: us extrem low venous · 0.08mm/px · 14 of 56 slices shown]
[im 1/56]
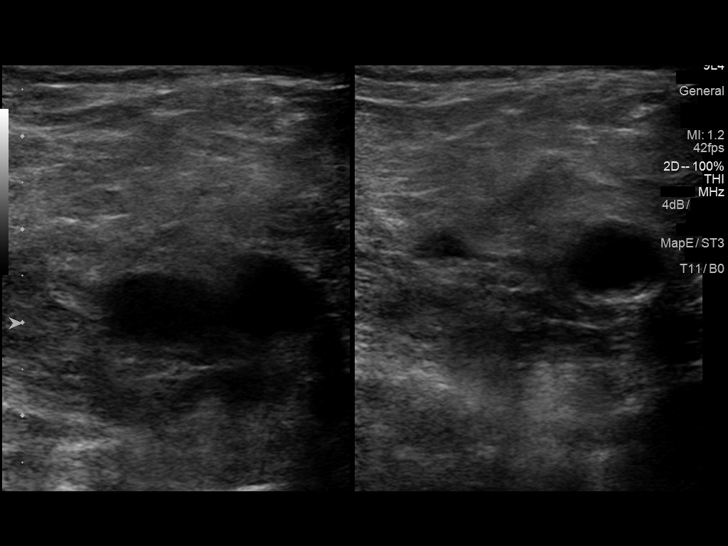
[im 5/56]
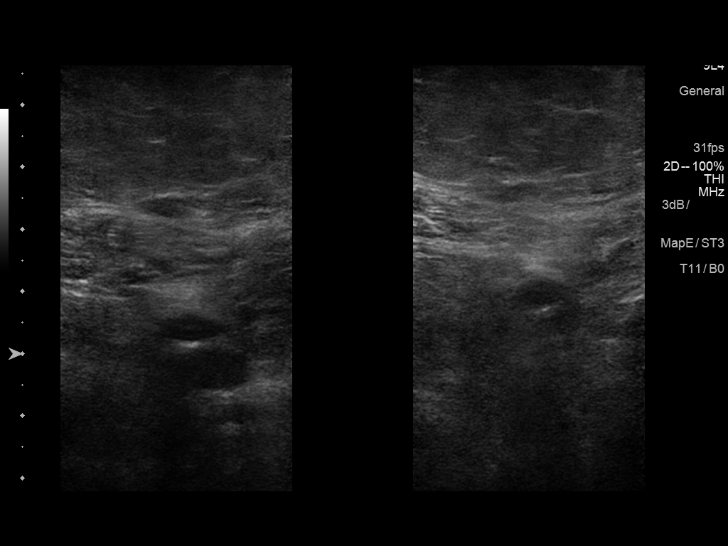
[im 10/56]
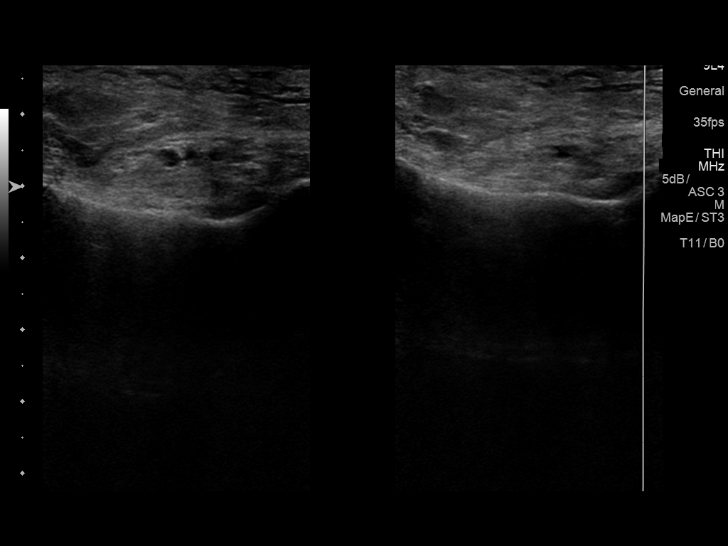
[im 15/56]
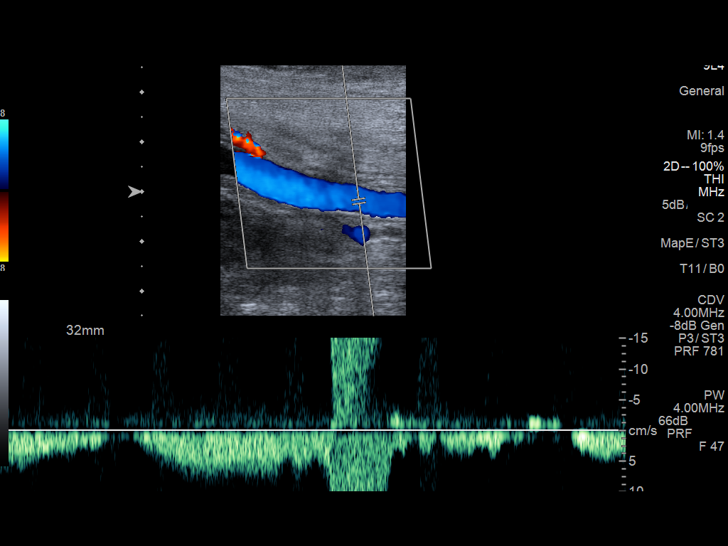
[im 17/56]
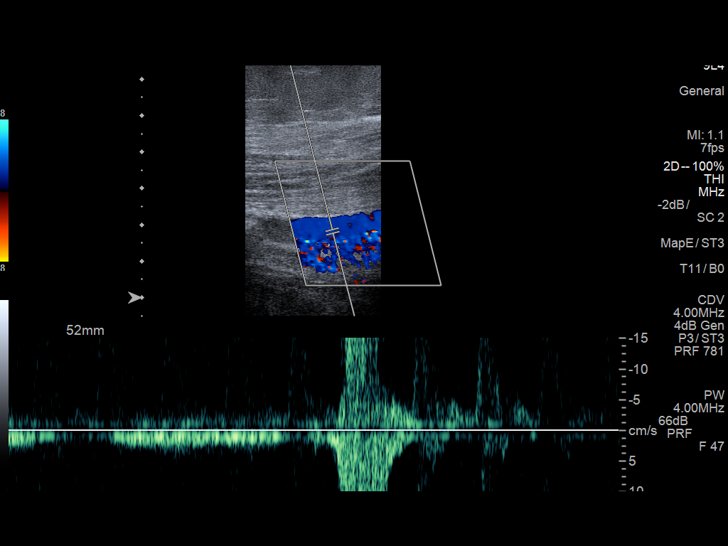
[im 22/56]
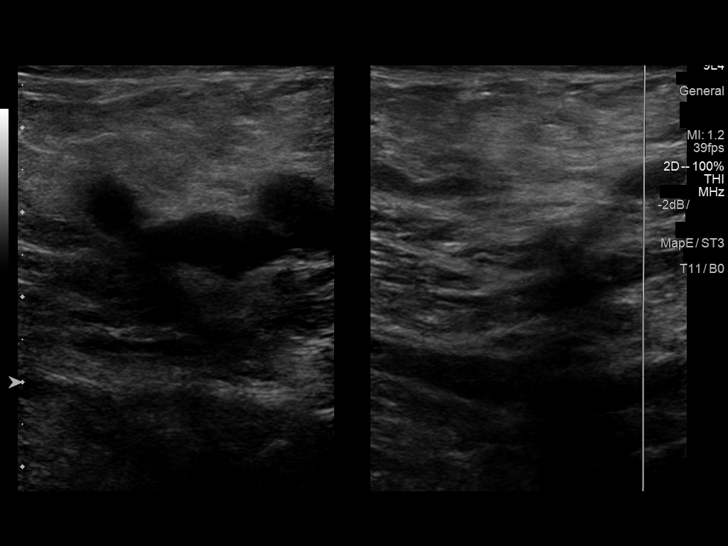
[im 27/56]
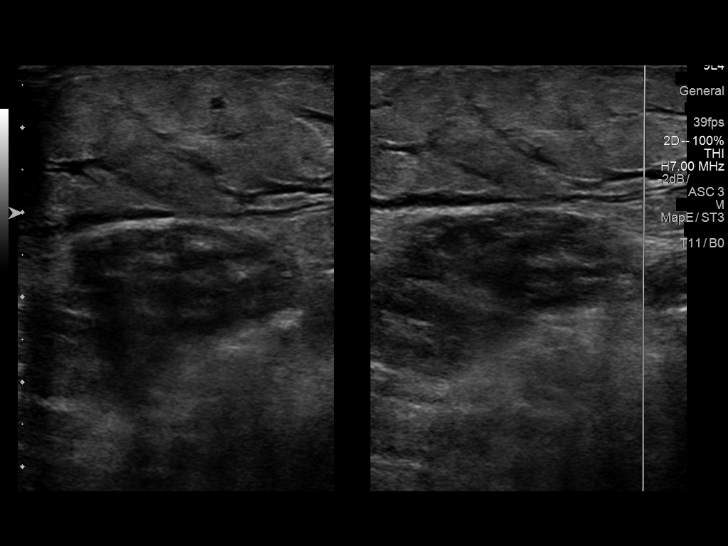
[im 29/56]
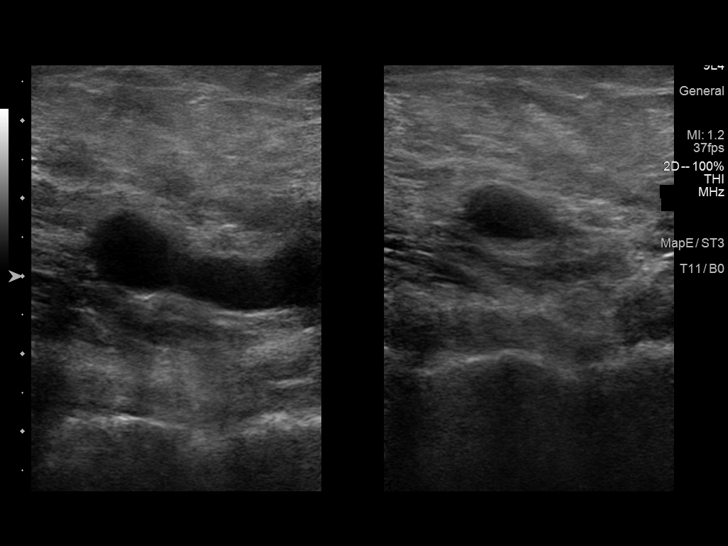
[im 34/56]
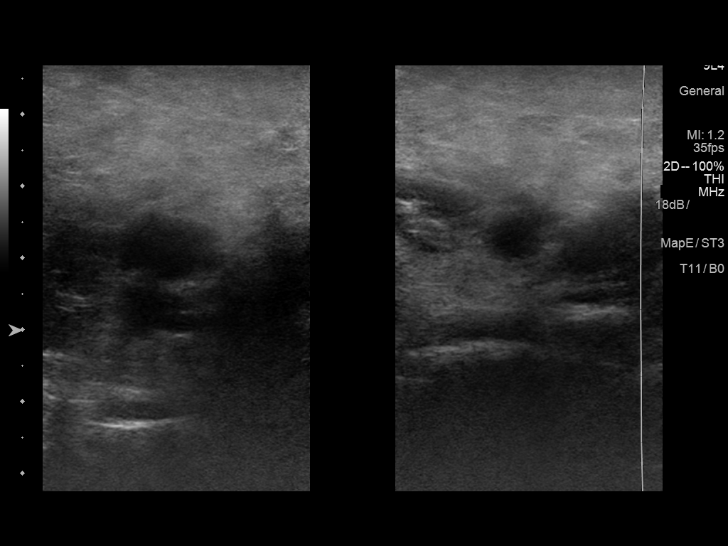
[im 39/56]
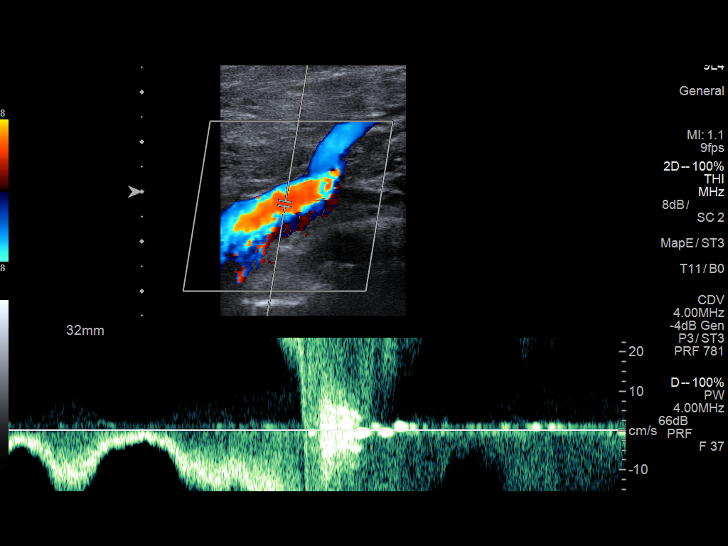
[im 44/56]
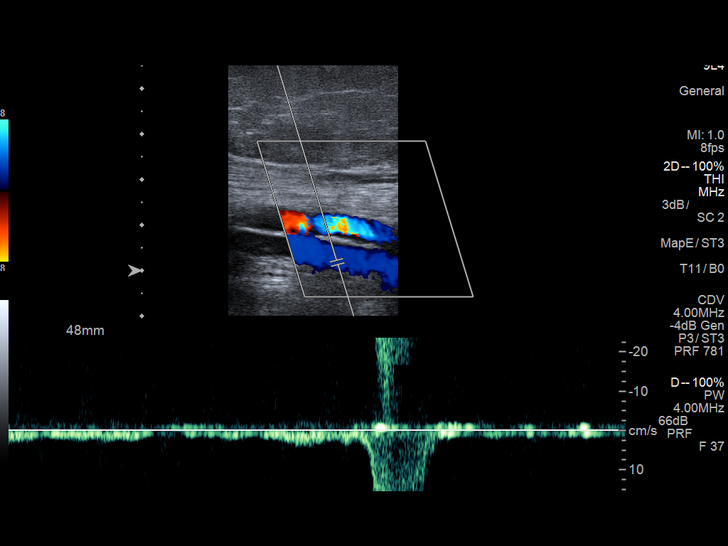
[im 46/56]
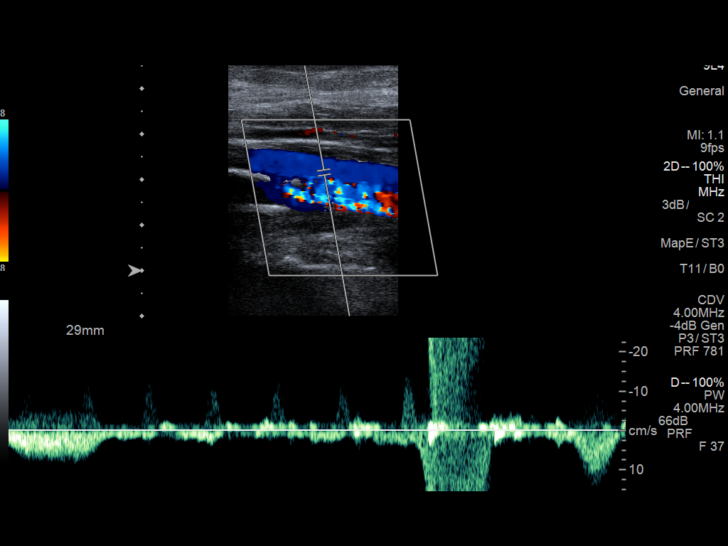
[im 51/56]
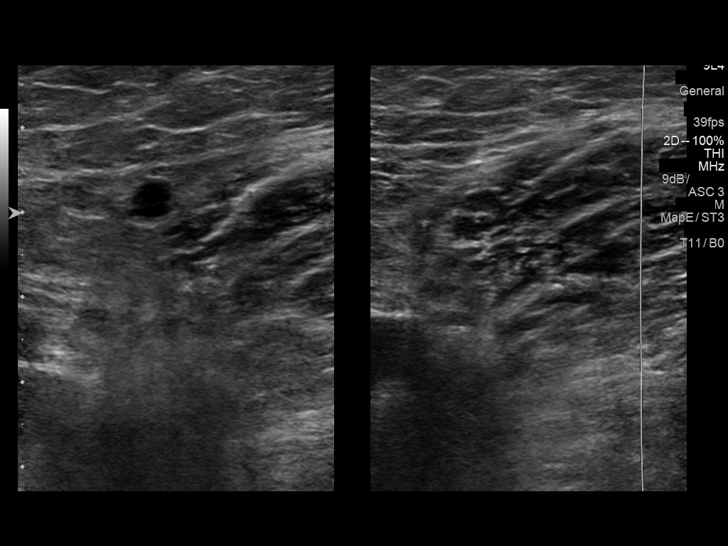
[im 56/56]
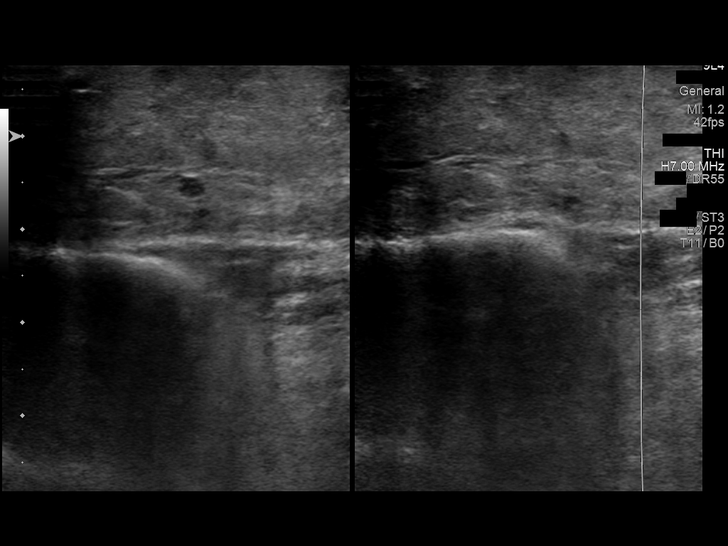

[14 of 24 positions shown; findings below may reference images not displayed]

FINDINGS: Normal compressibility of the common femoral, superficial femoral,
and popliteal veins, as well as the proximal calf veins. No filling
defects to suggest DVT on grayscale or color Doppler imaging.
Doppler waveforms show normal direction of venous flow, normal
respiratory phasicity and response to augmentation. Visualized
segments of the saphenous venous system normal in caliber and
compressibility. There is edema in the left distal calf.
IMPRESSION: No femoropopliteal and no calf DVT in the visualized calf veins. If
clinical symptoms are inconsistent or if there are persistent or
worsening symptoms, further imaging (possibly involving the iliac
veins) may be warranted.

## 2020-06-13 ENCOUNTER — Ambulatory Visit (INDEPENDENT_AMBULATORY_CARE_PROVIDER_SITE_OTHER): Payer: Medicare PPO

## 2020-06-13 DIAGNOSIS — J309 Allergic rhinitis, unspecified: Secondary | ICD-10-CM | POA: Diagnosis not present

## 2020-06-14 ENCOUNTER — Other Ambulatory Visit: Payer: Self-pay | Admitting: Pediatrics

## 2020-07-12 ENCOUNTER — Ambulatory Visit (INDEPENDENT_AMBULATORY_CARE_PROVIDER_SITE_OTHER): Payer: Medicare PPO

## 2020-07-12 DIAGNOSIS — J309 Allergic rhinitis, unspecified: Secondary | ICD-10-CM | POA: Diagnosis not present

## 2020-07-13 ENCOUNTER — Ambulatory Visit: Payer: Medicare PPO | Admitting: Allergy and Immunology

## 2020-07-15 ENCOUNTER — Encounter: Payer: Self-pay | Admitting: Allergy and Immunology

## 2020-07-15 ENCOUNTER — Other Ambulatory Visit: Payer: Self-pay

## 2020-07-15 ENCOUNTER — Ambulatory Visit (INDEPENDENT_AMBULATORY_CARE_PROVIDER_SITE_OTHER): Payer: Medicare PPO | Admitting: Allergy and Immunology

## 2020-07-15 VITALS — BP 140/68 | HR 86 | Temp 98.1°F | Resp 16

## 2020-07-15 DIAGNOSIS — J3089 Other allergic rhinitis: Secondary | ICD-10-CM | POA: Diagnosis not present

## 2020-07-15 DIAGNOSIS — J4520 Mild intermittent asthma, uncomplicated: Secondary | ICD-10-CM | POA: Diagnosis not present

## 2020-07-15 NOTE — Progress Notes (Signed)
[place] - [place] - [place] - [place]   Follow-up Note  Referring Provider: Lewis Moccasin, MD Primary Provider: Lewis Moccasin, MD Date of Office Visit: 07/15/2020  Subjective:   Rebecca Murray (DOB: 1945-06-25) is a 75 y.o. female who [facility] on 07[facility]15/2020 in re-evaluation of the following:  HPI: Rebecca Murray returns to this clinic in reevaluation of asthma and allergic rhinitis.  I have never seen her in this clinic and her last visit with our nurse practitioner was on 14 July 2019.  She continues to do very well with her respiratory tract issue and has not had an exacerbation of her asthma requiring her to receive a systemic steroid and rarely if ever uses a short acting bronchodilator.  She does not exercise to any degree because of her very significant knee arthritis that is treated with every 74-month systemic steroid injection.  She did apparently have an episode of "sinusitis" in November 2021 that required an antibiotic but that resolved within several days.  Otherwise she has had very little problems with her nose.  She continues on immunotherapy currently at every 4 weeks without any adverse effect.  She has received four Pfizer COVID vaccines.  Allergies as of 07/15/2020      Reactions   Dextromethorphan Nausea Only   Rosuvastatin    Muscle aches   Sertraline    migraine      Medication List      albuterol 108 (90 Base) MCG/ACT inhaler Commonly known as: VENTOLIN HFA INHALE 2 PUFFS BY MOUTH EVERY 4 HOURS AS NEEDED FOR COUGHING OR WHEEZING   buPROPion 150 MG 24 hr tablet Commonly known as: WELLBUTRIN XL   EPINEPHrine 0.3 mg/0.3 mL Soaj injection Commonly known as: EpiPen 2-Pak Use as directed for severe allergic reaction   ezetimibe 10 MG tablet Commonly known as: ZETIA TAKE 1 TABLET BY MOUTH DAILY AT BEDTIME FOR CHOLESTEROL   fexofenadine 180 MG tablet Commonly known as: ALLEGRA Take 180 mg by  mouth daily.   fluticasone 50 MCG/ACT nasal spray Commonly known as: FLONASE SHAKE LIQUID AND USE 1 SPRAY IN EACH NOSTRIL TWICE DAILY FOR NASAL CONGESTION   levothyroxine 50 MCG tablet Commonly known as: SYNTHROID 75 mcg.   multivitamin tablet Take 1 tablet by mouth daily.   NON FORMULARY Every week   pravastatin 10 MG tablet Commonly known as: PRAVACHOL Take 10 mg by mouth. Every two weeks   Vitamin D (Ergocalciferol) 1.25 MG (50000 UNIT) Caps capsule Commonly known as: DRISDOL Take 50,000 Units by mouth every 7 (seven) days.       Past Medical History:  Diagnosis Date  . Allergic rhinitis   . Allergy    grass, trees, environmental  . Arthritis   . Asthma   . Hyperlipidemia   . Osteoporosis   . Post-operative nausea and vomiting   . Thyroid disease     Past Surgical History:  Procedure Laterality Date  . TUBAL LIGATION    . WRIST SURGERY      Review of systems negative except as noted in HPI / PMHx or noted below:  Review of Systems  Constitutional: Negative.   HENT: Negative.   Eyes: Negative.   Respiratory: Negative.   Cardiovascular: Negative.   Gastrointestinal: Negative.   Genitourinary: Negative.   Musculoskeletal: Negative.   Skin: Negative.   Neurological: Negative.   Endo/Heme/Allergies: Negative.   Psychiatric/Behavioral: Negative.      Objective:   Vitals:  07/15/20 1058  BP: 140/68  Pulse: 86  Resp: 16  Temp: 98.1 F (36.7 C)  SpO2: 96%          Physical Exam Constitutional:      Appearance: She is not diaphoretic.     Comments: Difficult ambulation using 2 canes  HENT:     Head: Normocephalic.     Right Ear: Tympanic membrane, ear canal and external ear normal.     Left Ear: Tympanic membrane, ear canal and external ear normal.     Nose: Nose normal. No mucosal edema or rhinorrhea.     Mouth/Throat:     Pharynx: Uvula midline. No oropharyngeal exudate.  Eyes:     Conjunctiva/sclera: Conjunctivae normal.  Neck:      Thyroid: No thyromegaly.     Trachea: Trachea normal. No tracheal tenderness or tracheal deviation.  Cardiovascular:     Rate and Rhythm: Normal rate and regular rhythm.     Heart sounds: Normal heart sounds, S1 normal and S2 normal. No murmur heard.   Pulmonary:     Effort: No respiratory distress.     Breath sounds: Normal breath sounds. No stridor. No wheezing or rales.  Lymphadenopathy:     Head:     Right side of head: No tonsillar adenopathy.     Left side of head: No tonsillar adenopathy.     Cervical: No cervical adenopathy.  Skin:    Findings: No erythema or rash.     Nails: There is no clubbing.  Neurological:     Mental Status: She is alert.     Diagnostics:    Spirometry was performed and demonstrated an FEV1 of 1.85 at 92 % of predicted.  Assessment and Plan:   1. Asthma, mild intermittent, well-controlled   2. Perennial allergic rhinitis     1.  Continue immunotherapy  2.  Continue Flonase-2 sprays each nostril daily  3.  Continue Allegra, albuterol HFA if needed  4.  Return to clinic in 1 year or earlier if problem  5.  Consider meeting with orthopedic surgeon about knee replacement  Rebecca Murray appears to be doing very well on her current plan of therapy directed against inflammation of her respiratory tract and she will continue on the plan noted above and we will see her back in this clinic in 1 year.  On a different note, she has very significant knee arthritis and it appears to have really impacted her quality of life and ability to ambulate to any significant degree.  I did mention to her that she may want a visit with an orthopedic surgeon to see if she would qualify for knee replacements.  Rebecca Schimke, MD Allergy / Immunology [facility]

## 2020-07-15 NOTE — Patient Instructions (Addendum)
  1.  Continue immunotherapy  2.  Continue Flonase-2 sprays each nostril daily  3.  Continue Allegra, albuterol HFA if needed  4.  Return to clinic in 1 year or earlier if problem  5.  Consider meeting with orthopedic surgeon about knee replacement

## 2020-07-18 ENCOUNTER — Encounter: Payer: Self-pay | Admitting: Allergy and Immunology

## 2020-07-18 DIAGNOSIS — J301 Allergic rhinitis due to pollen: Secondary | ICD-10-CM | POA: Diagnosis not present

## 2020-07-18 NOTE — Progress Notes (Signed)
VIAL MADE & EXP 07-18-21 

## 2020-07-21 DIAGNOSIS — E559 Vitamin D deficiency, unspecified: Secondary | ICD-10-CM | POA: Diagnosis not present

## 2020-07-21 DIAGNOSIS — E039 Hypothyroidism, unspecified: Secondary | ICD-10-CM | POA: Diagnosis not present

## 2020-07-21 DIAGNOSIS — I10 Essential (primary) hypertension: Secondary | ICD-10-CM | POA: Diagnosis not present

## 2020-07-21 DIAGNOSIS — E782 Mixed hyperlipidemia: Secondary | ICD-10-CM | POA: Diagnosis not present

## 2020-07-26 DIAGNOSIS — E782 Mixed hyperlipidemia: Secondary | ICD-10-CM | POA: Diagnosis not present

## 2020-07-26 DIAGNOSIS — E559 Vitamin D deficiency, unspecified: Secondary | ICD-10-CM | POA: Diagnosis not present

## 2020-07-26 DIAGNOSIS — E039 Hypothyroidism, unspecified: Secondary | ICD-10-CM | POA: Diagnosis not present

## 2020-07-29 ENCOUNTER — Other Ambulatory Visit: Payer: Self-pay | Admitting: Family Medicine

## 2020-08-08 ENCOUNTER — Ambulatory Visit (INDEPENDENT_AMBULATORY_CARE_PROVIDER_SITE_OTHER): Payer: Medicare PPO

## 2020-08-08 DIAGNOSIS — J309 Allergic rhinitis, unspecified: Secondary | ICD-10-CM | POA: Diagnosis not present

## 2020-09-05 ENCOUNTER — Ambulatory Visit (INDEPENDENT_AMBULATORY_CARE_PROVIDER_SITE_OTHER): Payer: Medicare PPO

## 2020-09-05 DIAGNOSIS — J309 Allergic rhinitis, unspecified: Secondary | ICD-10-CM | POA: Diagnosis not present

## 2020-10-03 ENCOUNTER — Ambulatory Visit (INDEPENDENT_AMBULATORY_CARE_PROVIDER_SITE_OTHER): Payer: Medicare PPO

## 2020-10-03 DIAGNOSIS — J309 Allergic rhinitis, unspecified: Secondary | ICD-10-CM | POA: Diagnosis not present

## 2020-10-31 ENCOUNTER — Ambulatory Visit (INDEPENDENT_AMBULATORY_CARE_PROVIDER_SITE_OTHER): Payer: Medicare PPO

## 2020-10-31 DIAGNOSIS — J309 Allergic rhinitis, unspecified: Secondary | ICD-10-CM

## 2020-11-02 ENCOUNTER — Other Ambulatory Visit: Payer: Self-pay | Admitting: Family Medicine

## 2020-11-02 DIAGNOSIS — Z1231 Encounter for screening mammogram for malignant neoplasm of breast: Secondary | ICD-10-CM

## 2020-11-09 DIAGNOSIS — Z23 Encounter for immunization: Secondary | ICD-10-CM | POA: Diagnosis not present

## 2020-11-14 ENCOUNTER — Ambulatory Visit (INDEPENDENT_AMBULATORY_CARE_PROVIDER_SITE_OTHER): Payer: Medicare PPO

## 2020-11-14 DIAGNOSIS — J309 Allergic rhinitis, unspecified: Secondary | ICD-10-CM

## 2020-11-28 ENCOUNTER — Ambulatory Visit (INDEPENDENT_AMBULATORY_CARE_PROVIDER_SITE_OTHER): Payer: Medicare PPO

## 2020-11-28 DIAGNOSIS — J309 Allergic rhinitis, unspecified: Secondary | ICD-10-CM

## 2020-12-06 ENCOUNTER — Other Ambulatory Visit: Payer: Self-pay

## 2020-12-06 ENCOUNTER — Ambulatory Visit
Admission: RE | Admit: 2020-12-06 | Discharge: 2020-12-06 | Disposition: A | Discharge status: Home / Self Care | Payer: Medicare PPO | Source: Ambulatory Visit | Attending: Family Medicine | Admitting: Family Medicine

## 2020-12-06 DIAGNOSIS — Z1231 Encounter for screening mammogram for malignant neoplasm of breast: Secondary | ICD-10-CM | POA: Diagnosis not present

## 2020-12-12 ENCOUNTER — Ambulatory Visit (INDEPENDENT_AMBULATORY_CARE_PROVIDER_SITE_OTHER): Payer: Medicare PPO

## 2020-12-12 DIAGNOSIS — J309 Allergic rhinitis, unspecified: Secondary | ICD-10-CM | POA: Diagnosis not present

## 2020-12-20 DIAGNOSIS — Z0000 Encounter for general adult medical examination without abnormal findings: Secondary | ICD-10-CM | POA: Diagnosis not present

## 2020-12-23 DIAGNOSIS — H5711 Ocular pain, right eye: Secondary | ICD-10-CM | POA: Diagnosis not present

## 2020-12-23 DIAGNOSIS — F411 Generalized anxiety disorder: Secondary | ICD-10-CM | POA: Diagnosis not present

## 2020-12-23 DIAGNOSIS — F331 Major depressive disorder, recurrent, moderate: Secondary | ICD-10-CM | POA: Diagnosis not present

## 2020-12-23 DIAGNOSIS — E039 Hypothyroidism, unspecified: Secondary | ICD-10-CM | POA: Diagnosis not present

## 2020-12-23 DIAGNOSIS — G4700 Insomnia, unspecified: Secondary | ICD-10-CM | POA: Diagnosis not present

## 2020-12-23 DIAGNOSIS — E782 Mixed hyperlipidemia: Secondary | ICD-10-CM | POA: Diagnosis not present

## 2020-12-26 ENCOUNTER — Ambulatory Visit (INDEPENDENT_AMBULATORY_CARE_PROVIDER_SITE_OTHER): Payer: Medicare PPO | Admitting: *Deleted

## 2020-12-26 DIAGNOSIS — J309 Allergic rhinitis, unspecified: Secondary | ICD-10-CM | POA: Diagnosis not present

## 2020-12-27 DIAGNOSIS — Z0000 Encounter for general adult medical examination without abnormal findings: Secondary | ICD-10-CM | POA: Diagnosis not present

## 2020-12-27 DIAGNOSIS — Z1339 Encounter for screening examination for other mental health and behavioral disorders: Secondary | ICD-10-CM | POA: Diagnosis not present

## 2020-12-27 DIAGNOSIS — Z1331 Encounter for screening for depression: Secondary | ICD-10-CM | POA: Diagnosis not present

## 2020-12-30 DIAGNOSIS — M170 Bilateral primary osteoarthritis of knee: Secondary | ICD-10-CM | POA: Diagnosis not present

## 2020-12-30 DIAGNOSIS — L309 Dermatitis, unspecified: Secondary | ICD-10-CM | POA: Diagnosis not present

## 2021-01-04 ENCOUNTER — Other Ambulatory Visit: Payer: Self-pay | Admitting: Family Medicine

## 2021-01-04 DIAGNOSIS — H25013 Cortical age-related cataract, bilateral: Secondary | ICD-10-CM | POA: Diagnosis not present

## 2021-01-04 DIAGNOSIS — E2839 Other primary ovarian failure: Secondary | ICD-10-CM

## 2021-01-04 DIAGNOSIS — H40003 Preglaucoma, unspecified, bilateral: Secondary | ICD-10-CM | POA: Diagnosis not present

## 2021-01-04 DIAGNOSIS — E559 Vitamin D deficiency, unspecified: Secondary | ICD-10-CM

## 2021-01-04 DIAGNOSIS — H2513 Age-related nuclear cataract, bilateral: Secondary | ICD-10-CM | POA: Diagnosis not present

## 2021-01-04 DIAGNOSIS — H524 Presbyopia: Secondary | ICD-10-CM | POA: Diagnosis not present

## 2021-01-04 DIAGNOSIS — H52203 Unspecified astigmatism, bilateral: Secondary | ICD-10-CM | POA: Diagnosis not present

## 2021-01-04 DIAGNOSIS — H04123 Dry eye syndrome of bilateral lacrimal glands: Secondary | ICD-10-CM | POA: Diagnosis not present

## 2021-01-04 DIAGNOSIS — H47393 Other disorders of optic disc, bilateral: Secondary | ICD-10-CM | POA: Diagnosis not present

## 2021-01-04 DIAGNOSIS — H5213 Myopia, bilateral: Secondary | ICD-10-CM | POA: Diagnosis not present

## 2021-01-17 DIAGNOSIS — M6281 Muscle weakness (generalized): Secondary | ICD-10-CM | POA: Diagnosis not present

## 2021-01-17 DIAGNOSIS — M170 Bilateral primary osteoarthritis of knee: Secondary | ICD-10-CM | POA: Diagnosis not present

## 2021-01-23 ENCOUNTER — Ambulatory Visit (INDEPENDENT_AMBULATORY_CARE_PROVIDER_SITE_OTHER): Payer: Medicare PPO

## 2021-01-23 DIAGNOSIS — J309 Allergic rhinitis, unspecified: Secondary | ICD-10-CM

## 2021-01-26 DIAGNOSIS — M25562 Pain in left knee: Secondary | ICD-10-CM | POA: Diagnosis not present

## 2021-01-26 DIAGNOSIS — Z1211 Encounter for screening for malignant neoplasm of colon: Secondary | ICD-10-CM | POA: Diagnosis not present

## 2021-01-26 DIAGNOSIS — M25561 Pain in right knee: Secondary | ICD-10-CM | POA: Diagnosis not present

## 2021-02-10 DIAGNOSIS — M25562 Pain in left knee: Secondary | ICD-10-CM | POA: Diagnosis not present

## 2021-02-10 DIAGNOSIS — M25561 Pain in right knee: Secondary | ICD-10-CM | POA: Diagnosis not present

## 2021-02-20 ENCOUNTER — Ambulatory Visit (INDEPENDENT_AMBULATORY_CARE_PROVIDER_SITE_OTHER): Payer: Medicare PPO

## 2021-02-20 DIAGNOSIS — J309 Allergic rhinitis, unspecified: Secondary | ICD-10-CM | POA: Diagnosis not present

## 2021-02-23 DIAGNOSIS — M25562 Pain in left knee: Secondary | ICD-10-CM | POA: Diagnosis not present

## 2021-02-23 DIAGNOSIS — M25561 Pain in right knee: Secondary | ICD-10-CM | POA: Diagnosis not present

## 2021-02-24 DIAGNOSIS — J301 Allergic rhinitis due to pollen: Secondary | ICD-10-CM | POA: Diagnosis not present

## 2021-02-24 NOTE — Progress Notes (Signed)
Exp 02/24/22 °

## 2021-03-20 ENCOUNTER — Ambulatory Visit (INDEPENDENT_AMBULATORY_CARE_PROVIDER_SITE_OTHER): Payer: Medicare PPO

## 2021-03-20 DIAGNOSIS — J309 Allergic rhinitis, unspecified: Secondary | ICD-10-CM | POA: Diagnosis not present

## 2021-03-24 DIAGNOSIS — I10 Essential (primary) hypertension: Secondary | ICD-10-CM | POA: Diagnosis not present

## 2021-03-24 DIAGNOSIS — E782 Mixed hyperlipidemia: Secondary | ICD-10-CM | POA: Diagnosis not present

## 2021-03-24 DIAGNOSIS — E559 Vitamin D deficiency, unspecified: Secondary | ICD-10-CM | POA: Diagnosis not present

## 2021-03-28 DIAGNOSIS — I10 Essential (primary) hypertension: Secondary | ICD-10-CM | POA: Diagnosis not present

## 2021-03-28 DIAGNOSIS — E782 Mixed hyperlipidemia: Secondary | ICD-10-CM | POA: Diagnosis not present

## 2021-03-28 DIAGNOSIS — E559 Vitamin D deficiency, unspecified: Secondary | ICD-10-CM | POA: Diagnosis not present

## 2021-03-28 DIAGNOSIS — M170 Bilateral primary osteoarthritis of knee: Secondary | ICD-10-CM | POA: Diagnosis not present

## 2021-03-30 DIAGNOSIS — E782 Mixed hyperlipidemia: Secondary | ICD-10-CM | POA: Diagnosis not present

## 2021-03-30 DIAGNOSIS — M170 Bilateral primary osteoarthritis of knee: Secondary | ICD-10-CM | POA: Diagnosis not present

## 2021-03-30 DIAGNOSIS — L309 Dermatitis, unspecified: Secondary | ICD-10-CM | POA: Diagnosis not present

## 2021-04-17 ENCOUNTER — Ambulatory Visit (INDEPENDENT_AMBULATORY_CARE_PROVIDER_SITE_OTHER): Payer: Medicare PPO

## 2021-04-17 DIAGNOSIS — J309 Allergic rhinitis, unspecified: Secondary | ICD-10-CM

## 2021-05-05 IMAGING — CR DG TOE 5TH LEFT
3 series · 3 of 3 positions shown · non-contrast
Comparison: None.

CLINICAL DATA: Toe injury

EXAM:
DG TOE 5TH LEFT

[x toes ap left]
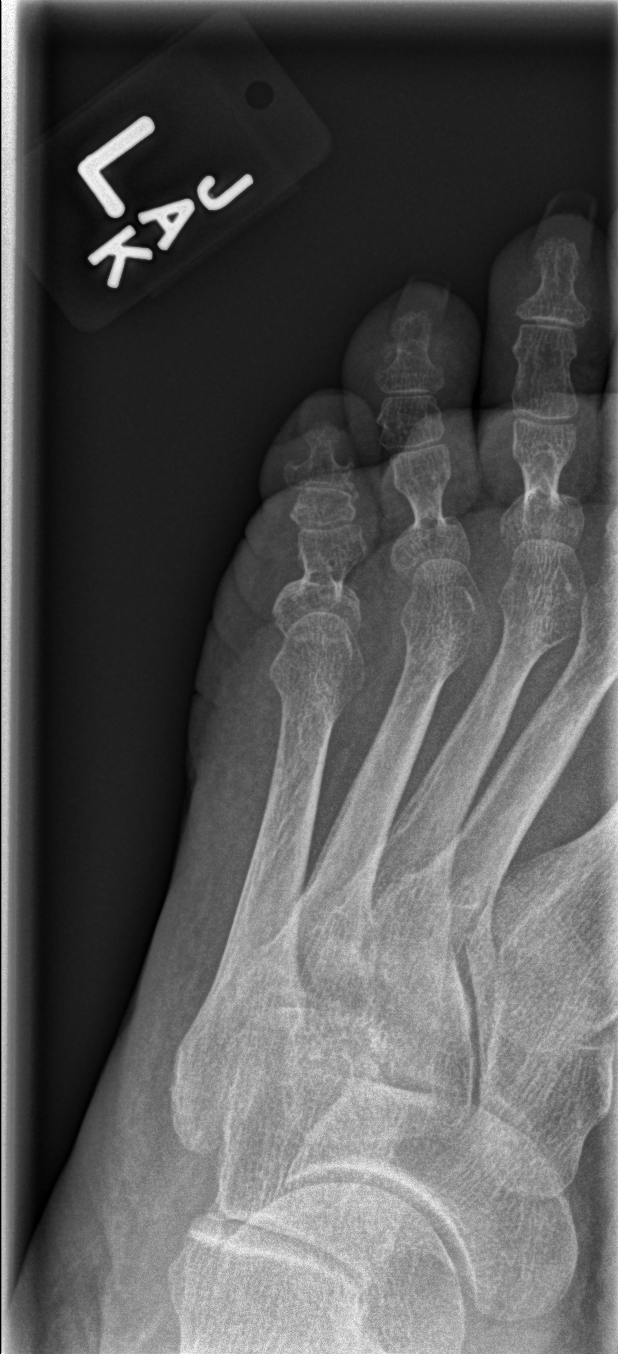

[x toes obl left]
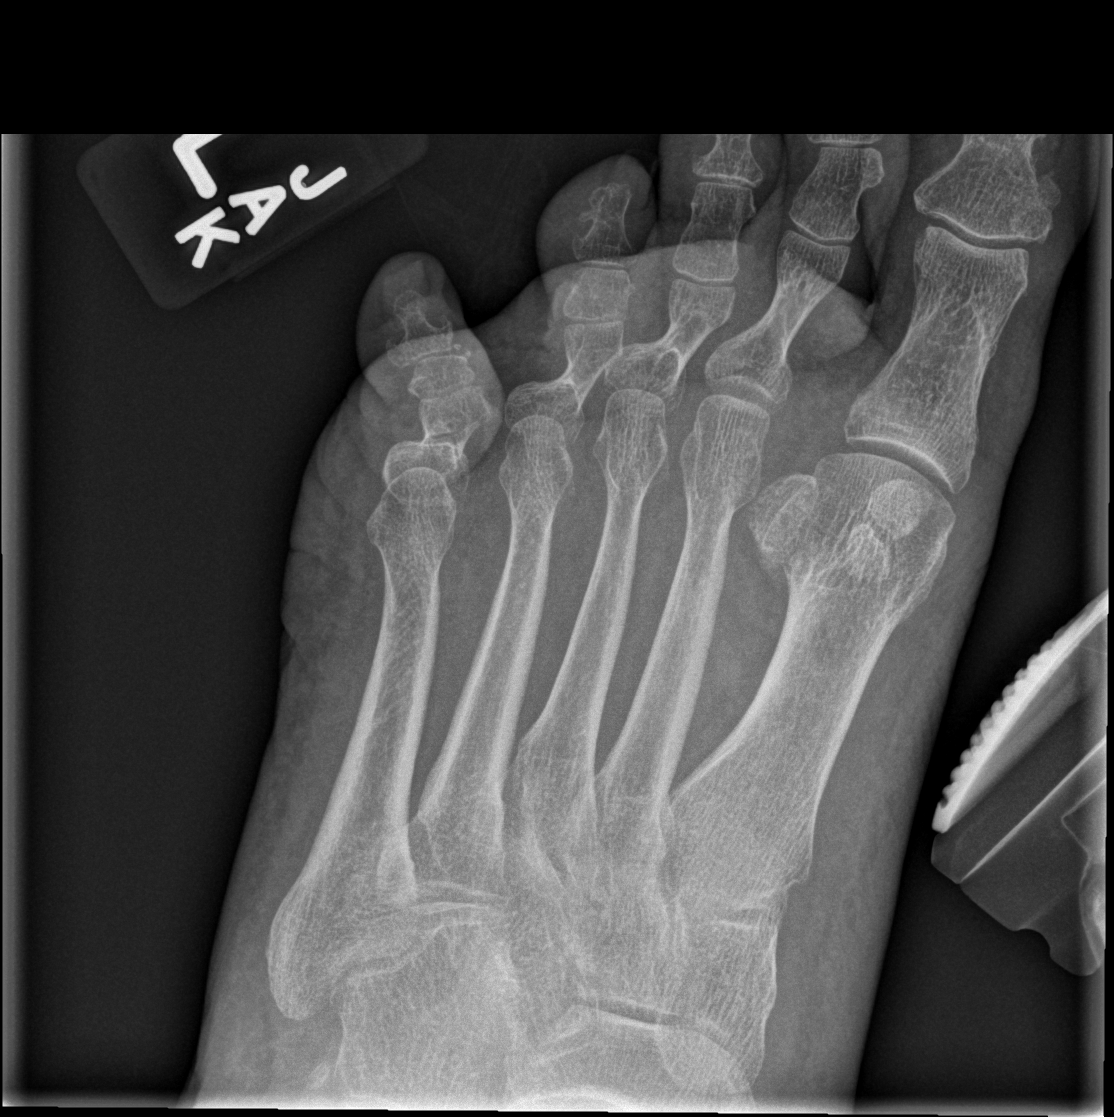

[x toes lat left]
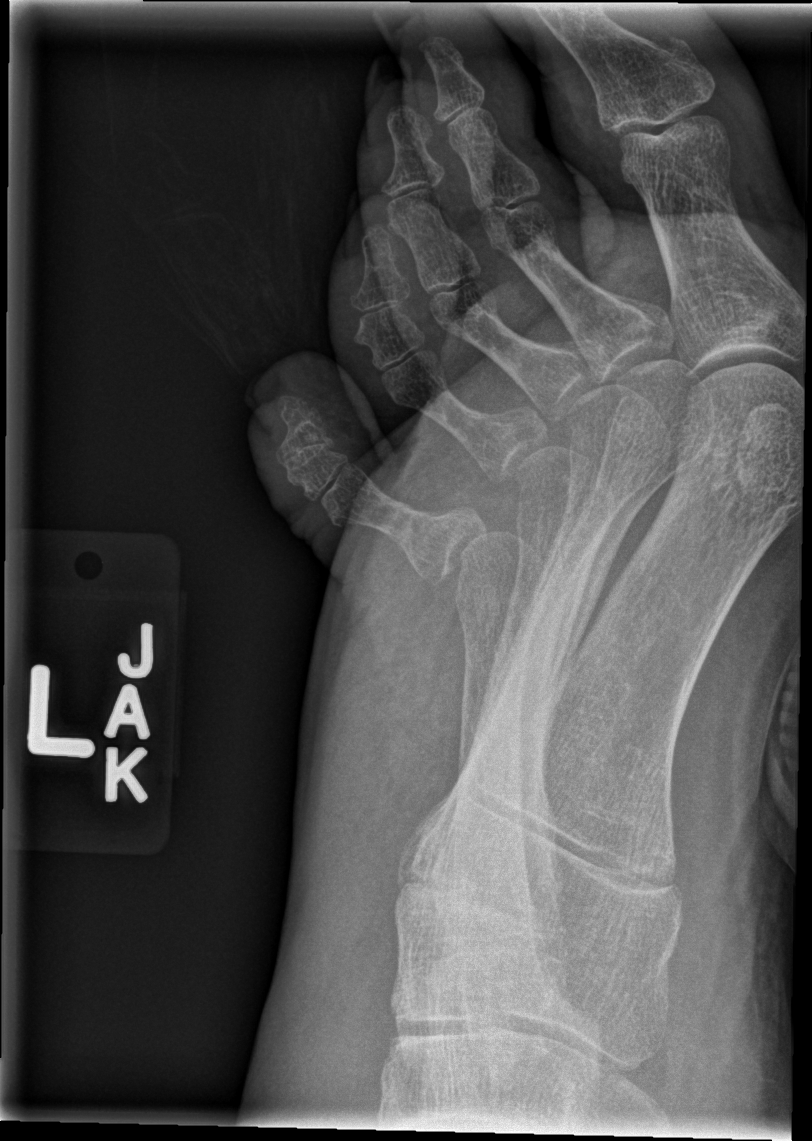

[3 of 3 positions shown; findings below may reference images not displayed]

FINDINGS: Suspected acute nondisplaced fracture at the midshaft of the fifth
proximal phalanx. No subluxation. No radiopaque foreign body.
IMPRESSION: Findings suspicious for acute nondisplaced fracture midshaft of
fifth proximal phalanx.

## 2021-05-15 ENCOUNTER — Ambulatory Visit (INDEPENDENT_AMBULATORY_CARE_PROVIDER_SITE_OTHER): Payer: Medicare PPO

## 2021-05-15 DIAGNOSIS — J309 Allergic rhinitis, unspecified: Secondary | ICD-10-CM | POA: Diagnosis not present

## 2021-05-24 DIAGNOSIS — N3000 Acute cystitis without hematuria: Secondary | ICD-10-CM | POA: Diagnosis not present

## 2021-06-12 ENCOUNTER — Ambulatory Visit (INDEPENDENT_AMBULATORY_CARE_PROVIDER_SITE_OTHER): Payer: Medicare PPO | Admitting: *Deleted

## 2021-06-12 DIAGNOSIS — J309 Allergic rhinitis, unspecified: Secondary | ICD-10-CM | POA: Diagnosis not present

## 2021-06-19 ENCOUNTER — Ambulatory Visit
Admission: RE | Admit: 2021-06-19 | Discharge: 2021-06-19 | Disposition: A | Discharge status: Home / Self Care | Payer: Medicare PPO | Source: Ambulatory Visit | Attending: Family Medicine | Admitting: Family Medicine

## 2021-06-19 DIAGNOSIS — M8589 Other specified disorders of bone density and structure, multiple sites: Secondary | ICD-10-CM | POA: Diagnosis not present

## 2021-06-19 DIAGNOSIS — E2839 Other primary ovarian failure: Secondary | ICD-10-CM

## 2021-06-19 DIAGNOSIS — Z780 Asymptomatic menopausal state: Secondary | ICD-10-CM | POA: Diagnosis not present

## 2021-06-19 DIAGNOSIS — E559 Vitamin D deficiency, unspecified: Secondary | ICD-10-CM

## 2021-06-19 DIAGNOSIS — M810 Age-related osteoporosis without current pathological fracture: Secondary | ICD-10-CM | POA: Diagnosis not present

## 2021-06-22 DIAGNOSIS — G4700 Insomnia, unspecified: Secondary | ICD-10-CM | POA: Diagnosis not present

## 2021-06-22 DIAGNOSIS — E559 Vitamin D deficiency, unspecified: Secondary | ICD-10-CM | POA: Diagnosis not present

## 2021-06-22 DIAGNOSIS — M810 Age-related osteoporosis without current pathological fracture: Secondary | ICD-10-CM | POA: Diagnosis not present

## 2021-06-26 ENCOUNTER — Ambulatory Visit (INDEPENDENT_AMBULATORY_CARE_PROVIDER_SITE_OTHER): Payer: Medicare PPO

## 2021-06-26 DIAGNOSIS — J309 Allergic rhinitis, unspecified: Secondary | ICD-10-CM | POA: Diagnosis not present

## 2021-07-03 DIAGNOSIS — E782 Mixed hyperlipidemia: Secondary | ICD-10-CM | POA: Diagnosis not present

## 2021-07-05 DIAGNOSIS — M170 Bilateral primary osteoarthritis of knee: Secondary | ICD-10-CM | POA: Diagnosis not present

## 2021-07-05 DIAGNOSIS — I10 Essential (primary) hypertension: Secondary | ICD-10-CM | POA: Diagnosis not present

## 2021-07-05 DIAGNOSIS — E782 Mixed hyperlipidemia: Secondary | ICD-10-CM | POA: Diagnosis not present

## 2021-07-11 ENCOUNTER — Ambulatory Visit (INDEPENDENT_AMBULATORY_CARE_PROVIDER_SITE_OTHER): Payer: Medicare PPO

## 2021-07-11 DIAGNOSIS — J309 Allergic rhinitis, unspecified: Secondary | ICD-10-CM

## 2021-07-12 NOTE — Progress Notes (Unsigned)
FOLLOW UP Date of Service/Encounter:  07/13/21   Subjective:  Micaella Gitto (DOB: August 07, 1945) is a 76 y.o. female who [facility] on 07/13/2021 in re-evaluation of the following: Allergic rhinitis, asthma History obtained from: chart review and patient.  For Review, LV was on 07/15/20  with Dr. Lucie Leather seen for asthma and allergic rhinitis.  Her FEV1 was 92% predicted at last visit.  Her asthma and allergic rhinitis were well controlled.  She continues on immunotherapy at every 4 weeks without adverse events  Today presents for follow-up. She continues on allergy immunotherapy receiving injections for grass, mold, dust mites.  She has been on maintenance red vial at 0.5 mL since at least 2016 coming every 4 weeks.  She has tolerated her injections well and never had a reaction. She uses her albuterol very infrequently.  Did use it once last week when outside in the pollen.  She was outside with her exterminator.  Prior to this, it had been over a year since she used albuterol.   She does use her flonase 1  SEN BID.  She feels very controlled.  Occasionally will use Allegra if needed.    Allergies as of 07/13/2021       Reactions   Dextromethorphan Nausea Only   Rosuvastatin    Muscle aches   Sertraline    migraine        Medication List        Accurate as of July 13, 2021  1:22 PM. If you have any questions, ask your nurse or doctor.          albuterol 108 (90 Base) MCG/ACT inhaler Commonly known as: VENTOLIN HFA Inhale 2 puffs by mouth every 4 Hrs as needed for coughing or wheezing. What changed: See the new instructions. Changed by: Tonny Bollman, MD   alendronate-cholecalciferol [phone] MG-UNIT tablet Commonly known as: FOSAMAX PLUS D   buPROPion 150 MG 24 hr tablet Commonly known as: WELLBUTRIN XL   Carboxymethylcellulose Sod PF 0.5 % Soln   diclofenac Sodium 1 % Gel Commonly known as: VOLTAREN Apply 1 application. topically 4  (four) times daily.   EPINEPHrine 0.3 mg/0.3 mL Soaj injection Commonly known as: EpiPen 2-Pak Use as directed for severe allergic reaction   ezetimibe 10 MG tablet Commonly known as: ZETIA TAKE 1 TABLET BY MOUTH DAILY AT BEDTIME FOR CHOLESTEROL   fexofenadine 180 MG tablet Commonly known as: ALLEGRA Take 1 tablet (180 mg total) by mouth daily. What changed: Another medication with the same name was removed. Continue taking this medication, and follow the directions you see here. Changed by: Tonny Bollman, MD   fluticasone 27.5 MCG/SPRAY nasal spray Commonly known as: VERAMYST Place into the nose.   fluticasone 50 MCG/ACT nasal spray Commonly known as: FLONASE Use 1 spray in each nostril twice a day for nasal congestion What changed: See the new instructions. Changed by: Tonny Bollman, MD   levothyroxine 75 MCG tablet Commonly known as: SYNTHROID levothyroxine 75 mcg tablet  TAKE 1 TABLET BY MOUTH DAILY IN THE MORNING ON AN EMPTY STOMACH What changed: Another medication with the same name was removed. Continue taking this medication, and follow the directions you see here. Changed by: Tonny Bollman, MD   multivitamin tablet Take 1 tablet by mouth daily.   NON FORMULARY Every week   pravastatin 10 MG tablet Commonly known as: PRAVACHOL Take 10 mg by mouth. Every two weeks   Vitamin D (Ergocalciferol) 1.25 MG (  50000 UNIT) Caps capsule Commonly known as: DRISDOL Take 50,000 Units by mouth every 7 (seven) days.       Past Medical History:  Diagnosis Date   Allergic rhinitis    Allergy    grass, trees, environmental   Arthritis    Asthma    Hyperlipidemia    Osteoporosis    Post-operative nausea and vomiting    Thyroid disease    Past Surgical History:  Procedure Laterality Date   TUBAL LIGATION     WRIST SURGERY     Otherwise, there have been no changes to her past medical history, surgical history, family history, or social history.  ROS: All others  negative except as noted per HPI.   Objective:  BP 140/66   Pulse 79   Temp 98.2 F (36.8 C) (Temporal)   Resp 17   Wt 170 lb 6.4 oz (77.3 kg)   SpO2 97%   BMI 30.19 kg/m  Body mass index is 30.19 kg/m. Physical Exam: General Appearance:  Alert, cooperative, no distress, appears stated age  Head:  Normocephalic, without obvious abnormality, atraumatic  Eyes:  Conjunctiva clear, EOM's intact  Nose: Nares normal, normal mucosa and no visible anterior polyps  Throat: Lips, tongue normal; teeth and gums normal, normal posterior oropharynx  Neck: Supple, symmetrical  Lungs:   clear to auscultation bilaterally, Respirations unlabored, no coughing and intermittent dry coughing  Heart:  regular rate and rhythm and no murmur, Appears well perfused  Extremities: No edema  Skin: Skin color, texture, turgor normal, no rashes or lesions on visualized portions of skin  Neurologic: No gross deficits   Assessment/Plan  Mrs. Ingram is a 76 year old female who we follow for allergic rhinitis and asthma.  She has been very well controlled on allergy injections which she wishes to continue.  1.  Continue immunotherapy  2.  Continue Flonase-2 sprays each nostril daily  3.  Continue Allegra, albuterol HFA if needed  4.  Return to clinic in 1 year or earlier if problem  Tonny Bollman, MD  [facility]

## 2021-07-13 ENCOUNTER — Encounter: Payer: Self-pay | Admitting: Internal Medicine

## 2021-07-13 ENCOUNTER — Ambulatory Visit (INDEPENDENT_AMBULATORY_CARE_PROVIDER_SITE_OTHER): Payer: Medicare PPO | Admitting: Internal Medicine

## 2021-07-13 VITALS — BP 140/66 | HR 79 | Temp 98.2°F | Resp 17 | Wt 170.4 lb

## 2021-07-13 DIAGNOSIS — J309 Allergic rhinitis, unspecified: Secondary | ICD-10-CM | POA: Diagnosis not present

## 2021-07-13 DIAGNOSIS — J4520 Mild intermittent asthma, uncomplicated: Secondary | ICD-10-CM

## 2021-07-13 MED ORDER — EPINEPHRINE 0.3 MG/0.3ML IJ SOAJ: INTRAMUSCULAR | 2 refills | Status: DC

## 2021-07-13 MED ORDER — FLUTICASONE PROPIONATE 50 MCG/ACT NA SUSP
NASAL | 11 refills | Status: DC
Start: 2021-07-13 — End: 2022-06-22

## 2021-07-13 MED ORDER — ALBUTEROL SULFATE HFA 108 (90 BASE) MCG/ACT IN AERS: INHALATION_SPRAY | RESPIRATORY_TRACT | 2 refills | Status: DC

## 2021-07-13 MED ORDER — FEXOFENADINE HCL 180 MG PO TABS
180.0000 mg | ORAL_TABLET | Freq: Every day | ORAL | 5 refills | Status: AC
Start: 2021-07-13 — End: ?

## 2021-07-13 NOTE — Patient Instructions (Addendum)
  1.  Continue immunotherapy  2.  Continue Flonase-2 sprays each nostril daily  3.  Continue Allegra, albuterol HFA if needed  4.  Return to clinic in 1 year or earlier if problem

## 2021-07-24 ENCOUNTER — Ambulatory Visit (INDEPENDENT_AMBULATORY_CARE_PROVIDER_SITE_OTHER): Payer: Medicare PPO

## 2021-07-24 DIAGNOSIS — J309 Allergic rhinitis, unspecified: Secondary | ICD-10-CM | POA: Diagnosis not present

## 2021-07-27 DIAGNOSIS — M170 Bilateral primary osteoarthritis of knee: Secondary | ICD-10-CM | POA: Diagnosis not present

## 2021-07-27 DIAGNOSIS — Z1211 Encounter for screening for malignant neoplasm of colon: Secondary | ICD-10-CM | POA: Diagnosis not present

## 2021-07-27 DIAGNOSIS — I10 Essential (primary) hypertension: Secondary | ICD-10-CM | POA: Diagnosis not present

## 2021-08-07 ENCOUNTER — Ambulatory Visit (INDEPENDENT_AMBULATORY_CARE_PROVIDER_SITE_OTHER): Payer: Medicare PPO

## 2021-08-07 DIAGNOSIS — J309 Allergic rhinitis, unspecified: Secondary | ICD-10-CM | POA: Diagnosis not present

## 2021-08-28 NOTE — Progress Notes (Signed)
VIAL EXP 08-29-22 

## 2021-08-29 DIAGNOSIS — J301 Allergic rhinitis due to pollen: Secondary | ICD-10-CM | POA: Diagnosis not present

## 2021-09-04 ENCOUNTER — Ambulatory Visit (INDEPENDENT_AMBULATORY_CARE_PROVIDER_SITE_OTHER): Payer: Medicare PPO

## 2021-09-04 DIAGNOSIS — J309 Allergic rhinitis, unspecified: Secondary | ICD-10-CM | POA: Diagnosis not present

## 2021-09-29 DIAGNOSIS — R030 Elevated blood-pressure reading, without diagnosis of hypertension: Secondary | ICD-10-CM | POA: Diagnosis not present

## 2021-09-29 DIAGNOSIS — N3000 Acute cystitis without hematuria: Secondary | ICD-10-CM | POA: Diagnosis not present

## 2021-10-02 ENCOUNTER — Ambulatory Visit (INDEPENDENT_AMBULATORY_CARE_PROVIDER_SITE_OTHER): Payer: Medicare PPO

## 2021-10-02 DIAGNOSIS — J309 Allergic rhinitis, unspecified: Secondary | ICD-10-CM | POA: Diagnosis not present

## 2021-10-23 ENCOUNTER — Other Ambulatory Visit: Payer: Self-pay | Admitting: Family Medicine

## 2021-10-23 DIAGNOSIS — Z1231 Encounter for screening mammogram for malignant neoplasm of breast: Secondary | ICD-10-CM

## 2021-10-25 DIAGNOSIS — E039 Hypothyroidism, unspecified: Secondary | ICD-10-CM | POA: Diagnosis not present

## 2021-10-25 DIAGNOSIS — E782 Mixed hyperlipidemia: Secondary | ICD-10-CM | POA: Diagnosis not present

## 2021-10-25 DIAGNOSIS — I10 Essential (primary) hypertension: Secondary | ICD-10-CM | POA: Diagnosis not present

## 2021-10-30 ENCOUNTER — Ambulatory Visit (INDEPENDENT_AMBULATORY_CARE_PROVIDER_SITE_OTHER): Payer: Medicare PPO

## 2021-10-30 DIAGNOSIS — J309 Allergic rhinitis, unspecified: Secondary | ICD-10-CM | POA: Diagnosis not present

## 2021-10-31 DIAGNOSIS — I10 Essential (primary) hypertension: Secondary | ICD-10-CM | POA: Diagnosis not present

## 2021-10-31 DIAGNOSIS — E782 Mixed hyperlipidemia: Secondary | ICD-10-CM | POA: Diagnosis not present

## 2021-10-31 DIAGNOSIS — G720 Drug-induced myopathy: Secondary | ICD-10-CM | POA: Diagnosis not present

## 2021-10-31 DIAGNOSIS — E039 Hypothyroidism, unspecified: Secondary | ICD-10-CM | POA: Diagnosis not present

## 2021-10-31 DIAGNOSIS — M170 Bilateral primary osteoarthritis of knee: Secondary | ICD-10-CM | POA: Diagnosis not present

## 2021-11-03 DIAGNOSIS — Z23 Encounter for immunization: Secondary | ICD-10-CM | POA: Diagnosis not present

## 2021-11-03 DIAGNOSIS — M170 Bilateral primary osteoarthritis of knee: Secondary | ICD-10-CM | POA: Diagnosis not present

## 2021-11-03 DIAGNOSIS — Z7185 Encounter for immunization safety counseling: Secondary | ICD-10-CM | POA: Diagnosis not present

## 2021-11-03 DIAGNOSIS — I10 Essential (primary) hypertension: Secondary | ICD-10-CM | POA: Diagnosis not present

## 2021-11-27 ENCOUNTER — Ambulatory Visit (INDEPENDENT_AMBULATORY_CARE_PROVIDER_SITE_OTHER): Payer: Medicare PPO

## 2021-11-27 DIAGNOSIS — J309 Allergic rhinitis, unspecified: Secondary | ICD-10-CM | POA: Diagnosis not present

## 2021-12-01 DIAGNOSIS — I10 Essential (primary) hypertension: Secondary | ICD-10-CM | POA: Diagnosis not present

## 2021-12-01 DIAGNOSIS — G4700 Insomnia, unspecified: Secondary | ICD-10-CM | POA: Diagnosis not present

## 2021-12-01 DIAGNOSIS — F4321 Adjustment disorder with depressed mood: Secondary | ICD-10-CM | POA: Diagnosis not present

## 2021-12-07 ENCOUNTER — Ambulatory Visit: Payer: Medicare PPO

## 2021-12-18 DIAGNOSIS — N3000 Acute cystitis without hematuria: Secondary | ICD-10-CM | POA: Diagnosis not present

## 2021-12-18 DIAGNOSIS — M170 Bilateral primary osteoarthritis of knee: Secondary | ICD-10-CM | POA: Diagnosis not present

## 2021-12-18 DIAGNOSIS — M545 Low back pain, unspecified: Secondary | ICD-10-CM | POA: Diagnosis not present

## 2021-12-18 DIAGNOSIS — N390 Urinary tract infection, site not specified: Secondary | ICD-10-CM | POA: Diagnosis not present

## 2021-12-18 DIAGNOSIS — N952 Postmenopausal atrophic vaginitis: Secondary | ICD-10-CM | POA: Diagnosis not present

## 2021-12-25 ENCOUNTER — Ambulatory Visit (INDEPENDENT_AMBULATORY_CARE_PROVIDER_SITE_OTHER): Payer: Medicare PPO

## 2021-12-25 DIAGNOSIS — J309 Allergic rhinitis, unspecified: Secondary | ICD-10-CM | POA: Diagnosis not present

## 2021-12-28 DIAGNOSIS — Z1339 Encounter for screening examination for other mental health and behavioral disorders: Secondary | ICD-10-CM | POA: Diagnosis not present

## 2021-12-28 DIAGNOSIS — Z1331 Encounter for screening for depression: Secondary | ICD-10-CM | POA: Diagnosis not present

## 2021-12-28 DIAGNOSIS — Z0000 Encounter for general adult medical examination without abnormal findings: Secondary | ICD-10-CM | POA: Diagnosis not present

## 2022-01-08 DIAGNOSIS — H2513 Age-related nuclear cataract, bilateral: Secondary | ICD-10-CM | POA: Diagnosis not present

## 2022-01-08 DIAGNOSIS — H5213 Myopia, bilateral: Secondary | ICD-10-CM | POA: Diagnosis not present

## 2022-01-08 DIAGNOSIS — H52203 Unspecified astigmatism, bilateral: Secondary | ICD-10-CM | POA: Diagnosis not present

## 2022-01-08 DIAGNOSIS — H524 Presbyopia: Secondary | ICD-10-CM | POA: Diagnosis not present

## 2022-01-08 DIAGNOSIS — H40003 Preglaucoma, unspecified, bilateral: Secondary | ICD-10-CM | POA: Diagnosis not present

## 2022-01-08 DIAGNOSIS — H25013 Cortical age-related cataract, bilateral: Secondary | ICD-10-CM | POA: Diagnosis not present

## 2022-01-08 DIAGNOSIS — H04123 Dry eye syndrome of bilateral lacrimal glands: Secondary | ICD-10-CM | POA: Diagnosis not present

## 2022-01-09 ENCOUNTER — Ambulatory Visit
Admission: RE | Admit: 2022-01-09 | Discharge: 2022-01-09 | Disposition: A | Discharge status: Home / Self Care | Payer: Medicare PPO | Source: Ambulatory Visit | Attending: Family Medicine | Admitting: Family Medicine

## 2022-01-09 DIAGNOSIS — Z1231 Encounter for screening mammogram for malignant neoplasm of breast: Secondary | ICD-10-CM | POA: Diagnosis not present

## 2022-01-22 ENCOUNTER — Ambulatory Visit (INDEPENDENT_AMBULATORY_CARE_PROVIDER_SITE_OTHER): Payer: Medicare PPO

## 2022-01-22 DIAGNOSIS — J309 Allergic rhinitis, unspecified: Secondary | ICD-10-CM

## 2022-02-07 ENCOUNTER — Ambulatory Visit (INDEPENDENT_AMBULATORY_CARE_PROVIDER_SITE_OTHER): Payer: Medicare PPO | Admitting: *Deleted

## 2022-02-07 DIAGNOSIS — J309 Allergic rhinitis, unspecified: Secondary | ICD-10-CM | POA: Diagnosis not present

## 2022-02-19 ENCOUNTER — Ambulatory Visit (INDEPENDENT_AMBULATORY_CARE_PROVIDER_SITE_OTHER): Payer: Medicare PPO

## 2022-02-19 DIAGNOSIS — J309 Allergic rhinitis, unspecified: Secondary | ICD-10-CM

## 2022-02-28 DIAGNOSIS — E559 Vitamin D deficiency, unspecified: Secondary | ICD-10-CM | POA: Diagnosis not present

## 2022-02-28 DIAGNOSIS — M170 Bilateral primary osteoarthritis of knee: Secondary | ICD-10-CM | POA: Diagnosis not present

## 2022-02-28 DIAGNOSIS — Z7185 Encounter for immunization safety counseling: Secondary | ICD-10-CM | POA: Diagnosis not present

## 2022-02-28 DIAGNOSIS — I10 Essential (primary) hypertension: Secondary | ICD-10-CM | POA: Diagnosis not present

## 2022-02-28 DIAGNOSIS — Z23 Encounter for immunization: Secondary | ICD-10-CM | POA: Diagnosis not present

## 2022-02-28 DIAGNOSIS — M1711 Unilateral primary osteoarthritis, right knee: Secondary | ICD-10-CM | POA: Diagnosis not present

## 2022-02-28 DIAGNOSIS — M1712 Unilateral primary osteoarthritis, left knee: Secondary | ICD-10-CM | POA: Diagnosis not present

## 2022-03-05 ENCOUNTER — Ambulatory Visit (INDEPENDENT_AMBULATORY_CARE_PROVIDER_SITE_OTHER): Payer: Medicare PPO

## 2022-03-05 DIAGNOSIS — J309 Allergic rhinitis, unspecified: Secondary | ICD-10-CM

## 2022-03-19 ENCOUNTER — Ambulatory Visit (INDEPENDENT_AMBULATORY_CARE_PROVIDER_SITE_OTHER): Payer: Medicare PPO

## 2022-03-19 DIAGNOSIS — J309 Allergic rhinitis, unspecified: Secondary | ICD-10-CM | POA: Diagnosis not present

## 2022-03-21 DIAGNOSIS — M170 Bilateral primary osteoarthritis of knee: Secondary | ICD-10-CM | POA: Diagnosis not present

## 2022-03-21 DIAGNOSIS — G4700 Insomnia, unspecified: Secondary | ICD-10-CM | POA: Diagnosis not present

## 2022-03-21 DIAGNOSIS — E559 Vitamin D deficiency, unspecified: Secondary | ICD-10-CM | POA: Diagnosis not present

## 2022-03-21 DIAGNOSIS — M810 Age-related osteoporosis without current pathological fracture: Secondary | ICD-10-CM | POA: Diagnosis not present

## 2022-04-09 DIAGNOSIS — J301 Allergic rhinitis due to pollen: Secondary | ICD-10-CM | POA: Diagnosis not present

## 2022-04-09 NOTE — Progress Notes (Signed)
VIAL EXP 04-10-23

## 2022-04-16 ENCOUNTER — Ambulatory Visit (INDEPENDENT_AMBULATORY_CARE_PROVIDER_SITE_OTHER): Payer: Medicare PPO

## 2022-04-16 DIAGNOSIS — J309 Allergic rhinitis, unspecified: Secondary | ICD-10-CM

## 2022-05-14 ENCOUNTER — Ambulatory Visit (INDEPENDENT_AMBULATORY_CARE_PROVIDER_SITE_OTHER): Payer: Medicare PPO

## 2022-05-14 DIAGNOSIS — J309 Allergic rhinitis, unspecified: Secondary | ICD-10-CM

## 2022-05-21 DIAGNOSIS — N952 Postmenopausal atrophic vaginitis: Secondary | ICD-10-CM | POA: Diagnosis not present

## 2022-05-21 DIAGNOSIS — M170 Bilateral primary osteoarthritis of knee: Secondary | ICD-10-CM | POA: Diagnosis not present

## 2022-05-21 DIAGNOSIS — I10 Essential (primary) hypertension: Secondary | ICD-10-CM | POA: Diagnosis not present

## 2022-06-11 ENCOUNTER — Ambulatory Visit (INDEPENDENT_AMBULATORY_CARE_PROVIDER_SITE_OTHER): Payer: Medicare PPO

## 2022-06-11 DIAGNOSIS — J309 Allergic rhinitis, unspecified: Secondary | ICD-10-CM

## 2022-06-22 ENCOUNTER — Other Ambulatory Visit: Payer: Self-pay | Admitting: Internal Medicine

## 2022-07-02 DIAGNOSIS — Z23 Encounter for immunization: Secondary | ICD-10-CM | POA: Diagnosis not present

## 2022-07-02 DIAGNOSIS — I10 Essential (primary) hypertension: Secondary | ICD-10-CM | POA: Diagnosis not present

## 2022-07-02 DIAGNOSIS — N8110 Cystocele, unspecified: Secondary | ICD-10-CM | POA: Diagnosis not present

## 2022-07-02 DIAGNOSIS — M170 Bilateral primary osteoarthritis of knee: Secondary | ICD-10-CM | POA: Diagnosis not present

## 2022-07-02 DIAGNOSIS — N760 Acute vaginitis: Secondary | ICD-10-CM | POA: Diagnosis not present

## 2022-07-10 ENCOUNTER — Ambulatory Visit (INDEPENDENT_AMBULATORY_CARE_PROVIDER_SITE_OTHER): Payer: Medicare PPO

## 2022-07-10 DIAGNOSIS — J309 Allergic rhinitis, unspecified: Secondary | ICD-10-CM

## 2022-07-14 ENCOUNTER — Other Ambulatory Visit: Payer: Self-pay | Admitting: Internal Medicine

## 2022-07-16 NOTE — Telephone Encounter (Signed)
Needs Follow up

## 2022-07-17 NOTE — Progress Notes (Unsigned)
FOLLOW UP Date of Service/Encounter:  07/19/22   Subjective:  Rebecca Murray (DOB: 10/08/1945) is a 77 y.o. female who [facility] in re-evaluation of the following: Allergic rhinitis, asthma  History obtained from: chart review and patient.  For Review, LV was on 07/13/21  with Dr.Ronson Hagins seen for routine follow-up. See below for summary of history and diagnostics.  Therapeutic plans/changes recommended: Doing well on AIT, and wished to continue. ----------------------------------------------------- Pertinent History/Diagnostics:  Asthma: Mild intermittent asthma.  Infrequent use of albuterol. Allergic Rhinitis:  Continues on AIT for grass, mold, dust mites.  Has been on maintenance red vial at 0.5 mL since at least 2016, coming every 4 weeks.  Has tolerated injections well without previous reactions. Controlled on Flonase 1 SEN twice daily. --------------------------------------------------- Today presents for follow-up. She has been doing excellent since our last visit.  She continues on allergy injections coming every 4 weeks.  Has been on allergy injections for more than 40 years.  She does continue to take Flonase daily as well as Allegra daily.  She will occasionally notice her asthma acting up if she is around pollen or if doing too much exercise.  Infrequently needing her rescue inhaler.  Last used it 2 months ago when sweeping her garage during high pollen counts. She is interested in considering stopping her allergy injections.  She has not had any sinusitis infections in over a year.  She has not had any major allergy flares in years.  Allergies as of 07/19/2022       Reactions   Dextromethorphan Nausea Only   Rosuvastatin    Muscle aches   Sertraline    migraine        Medication List        Accurate as of July 19, 2022  1:15 PM. If you have any questions, ask your nurse or doctor.          albuterol 108 (90 Base)  MCG/ACT inhaler Commonly known as: VENTOLIN HFA Inhale 2 puffs by mouth every 4 Hrs as needed for coughing or wheezing.   alendronate-cholecalciferol 70-2800 MG-UNIT tablet Commonly known as: FOSAMAX PLUS D   buPROPion 150 MG 24 hr tablet Commonly known as: WELLBUTRIN XL   Carboxymethylcellulose Sod PF 0.5 % Soln   diclofenac Sodium 1 % Gel Commonly known as: VOLTAREN Apply 1 application. topically 4 (four) times daily.   EPINEPHrine 0.3 mg/0.3 mL Soaj injection Commonly known as: EpiPen 2-Pak Use as directed for severe allergic reaction   ezetimibe 10 MG tablet Commonly known as: ZETIA TAKE 1 TABLET BY MOUTH DAILY AT BEDTIME FOR CHOLESTEROL   fexofenadine 180 MG tablet Commonly known as: ALLEGRA Take 1 tablet (180 mg total) by mouth daily.   fluticasone 50 MCG/ACT nasal spray Commonly known as: FLONASE SHAKE LIQUID AND USE 1 SPRAY IN EACH NOSTRIL TWICE DAILY FOR NASAL CONGESTION   levothyroxine 75 MCG tablet Commonly known as: SYNTHROID levothyroxine 75 mcg tablet  TAKE 1 TABLET BY MOUTH DAILY IN THE MORNING ON AN EMPTY STOMACH   multivitamin tablet Take 1 tablet by mouth daily.   NON FORMULARY Every week   pravastatin 10 MG tablet Commonly known as: PRAVACHOL Take 10 mg by mouth. Every two weeks   Vitamin D (Ergocalciferol) 1.25 MG (50000 UNIT) Caps capsule Commonly known as: DRISDOL Take 50,000 Units by mouth every 7 (seven) days.       Past Medical History:  Diagnosis Date   Allergic rhinitis  Allergy    grass, trees, environmental   Arthritis    Asthma    Hyperlipidemia    Osteoporosis    Post-operative nausea and vomiting    Thyroid disease    Past Surgical History:  Procedure Laterality Date   TUBAL LIGATION     WRIST SURGERY     Otherwise, there have been no changes to her past medical history, surgical history, family history, or social history.  ROS: All others negative except as noted per HPI.   Objective:  BP 132/88 (BP  Location: Right Arm, Patient Position: Sitting, Cuff Size: Normal)   Pulse 75   Temp 97.9 F (36.6 C) (Temporal)   Resp 18   Ht 5\' 4"  (1.626 m)   Wt 164 lb 9.6 oz (74.7 kg)   SpO2 98%   BMI 28.25 kg/m  Body mass index is 28.25 kg/m. Physical Exam: General Appearance:  Alert, cooperative, no distress, appears stated age  Head:  Normocephalic, without obvious abnormality, atraumatic  Eyes:  Conjunctiva clear, EOM's intact  Nose: Nares normal, normal mucosa  Throat: Lips, tongue normal; teeth and gums normal, normal posterior oropharynx  Neck: Supple, symmetrical  Lungs:   clear to auscultation bilaterally, Respirations unlabored, no coughing  Heart:  regular rate and rhythm and no murmur, Appears well perfused  Extremities: No edema  Skin: Skin color, texture, turgor normal and no rashes or lesions on visualized portions of skin  Neurologic: No gross deficits   Spirometry:  Tracings reviewed. Her effort: Good reproducible efforts. FVC: 2.14L FEV1: 1.81L, 92% predicted FEV1/FVC ratio: 109% Interpretation: Spirometry consistent with normal pattern.  Please see scanned spirometry results for details.  Assessment/Plan  Ms. Haab has done excellent during her allergy injection therapy.  Her allergies have significantly improved, and she rarely has flares of her asthma or allergies.  We discussed that current guidelines recommend 3 to 5 years of maintenance as completed therapy, and she has more than surpassed this recommendation.  I suspect that she will do just fine stopping her injections, but recommend she continue taking her Flonase and Allegra as needed as well as her albuterol.  She will follow-up with [place] yearly unless an issue arises. She has been a pleasure to have in clinic all these years and will be sorely missed by our staff.   1.  Discontinue immunotherapy-you have around one injection left in current vials - documentation signed to stop therapy, no additional vials to  be made  2.  Continue Flonase-2 sprays each nostril daily  3.  Continue Allegra, albuterol HFA if needed  4.  Return to clinic in 1 year or earlier if problem  Tonny Bollman, MD  [facility]

## 2022-07-19 ENCOUNTER — Ambulatory Visit: Payer: Medicare PPO | Admitting: Internal Medicine

## 2022-07-19 ENCOUNTER — Other Ambulatory Visit: Payer: Self-pay

## 2022-07-19 ENCOUNTER — Encounter: Payer: Self-pay | Admitting: Internal Medicine

## 2022-07-19 VITALS — BP 132/88 | HR 75 | Temp 97.9°F | Resp 18 | Ht 64.0 in | Wt 164.6 lb

## 2022-07-19 DIAGNOSIS — J4520 Mild intermittent asthma, uncomplicated: Secondary | ICD-10-CM | POA: Diagnosis not present

## 2022-07-19 DIAGNOSIS — J3089 Other allergic rhinitis: Secondary | ICD-10-CM | POA: Diagnosis not present

## 2022-07-19 DIAGNOSIS — J309 Allergic rhinitis, unspecified: Secondary | ICD-10-CM | POA: Insufficient documentation

## 2022-07-19 NOTE — Patient Instructions (Addendum)
  1.  Discontinue immunotherapy-you have around one injection left  2.  Continue Flonase-2 sprays each nostril daily  3.  Continue Allegra, albuterol HFA if needed  4.  Return to clinic in 1 year or earlier if problem

## 2022-07-24 DIAGNOSIS — I10 Essential (primary) hypertension: Secondary | ICD-10-CM | POA: Diagnosis not present

## 2022-07-24 DIAGNOSIS — E782 Mixed hyperlipidemia: Secondary | ICD-10-CM | POA: Diagnosis not present

## 2022-07-24 DIAGNOSIS — E039 Hypothyroidism, unspecified: Secondary | ICD-10-CM | POA: Diagnosis not present

## 2022-07-24 DIAGNOSIS — E559 Vitamin D deficiency, unspecified: Secondary | ICD-10-CM | POA: Diagnosis not present

## 2022-08-06 ENCOUNTER — Ambulatory Visit (INDEPENDENT_AMBULATORY_CARE_PROVIDER_SITE_OTHER): Payer: Medicare PPO

## 2022-08-06 DIAGNOSIS — J309 Allergic rhinitis, unspecified: Secondary | ICD-10-CM

## 2022-08-07 DIAGNOSIS — N819 Female genital prolapse, unspecified: Secondary | ICD-10-CM | POA: Diagnosis not present

## 2022-08-09 DIAGNOSIS — E039 Hypothyroidism, unspecified: Secondary | ICD-10-CM | POA: Diagnosis not present

## 2022-08-09 DIAGNOSIS — E782 Mixed hyperlipidemia: Secondary | ICD-10-CM | POA: Diagnosis not present

## 2022-08-09 DIAGNOSIS — G720 Drug-induced myopathy: Secondary | ICD-10-CM | POA: Diagnosis not present

## 2022-08-09 DIAGNOSIS — I10 Essential (primary) hypertension: Secondary | ICD-10-CM | POA: Diagnosis not present

## 2022-08-09 DIAGNOSIS — Z789 Other specified health status: Secondary | ICD-10-CM | POA: Diagnosis not present

## 2022-08-09 DIAGNOSIS — E559 Vitamin D deficiency, unspecified: Secondary | ICD-10-CM | POA: Diagnosis not present

## 2022-08-09 DIAGNOSIS — N8110 Cystocele, unspecified: Secondary | ICD-10-CM | POA: Diagnosis not present

## 2022-09-12 ENCOUNTER — Ambulatory Visit: Payer: Medicare PPO | Attending: Obstetrics and Gynecology

## 2022-09-12 ENCOUNTER — Other Ambulatory Visit: Payer: Self-pay

## 2022-09-12 DIAGNOSIS — M6281 Muscle weakness (generalized): Secondary | ICD-10-CM | POA: Insufficient documentation

## 2022-09-12 DIAGNOSIS — R279 Unspecified lack of coordination: Secondary | ICD-10-CM | POA: Diagnosis not present

## 2022-09-12 NOTE — Therapy (Signed)
OUTPATIENT PHYSICAL THERAPY FEMALE PELVIC EVALUATION   Patient Name: Rebecca Murray MRN: [id number] DOB:12-Sep-1945, 77 y.o., female Today's Date: 09/12/2022  END OF SESSION:  PT End of Session - 09/12/22 0802     Visit Number 1    Date for PT Re-Evaluation 11/07/22    Authorization Type [employer]    PT Start Time 0800    PT Stop Time 0845    PT Time Calculation (min) 45 min    Activity Tolerance Patient tolerated treatment well    Behavior During Therapy WFL for tasks assessed/performed             Past Medical History:  Diagnosis Date   Allergic rhinitis    Allergy    grass, trees, environmental   Arthritis    Asthma    Hyperlipidemia    Osteoporosis    Post-operative nausea and vomiting    Thyroid disease    Past Surgical History:  Procedure Laterality Date   TUBAL LIGATION     WRIST SURGERY     Patient Active Problem List   Diagnosis Date Noted   Allergic rhinitis due to allergen 07/19/2022   GERD (gastroesophageal reflux disease) 01/12/2019   Asthma 01/12/2019   Allergy 01/12/2019   Secondary hypertension 07/15/2017   Essential hypertension 07/15/2017   Mild intermittent asthma without complication 10/21/2014   Seasonal and perennial allergic rhinitis 10/21/2014    PCP: Lewis Moccasin, MD  REFERRING PROVIDER: Marcelle Overlie, MD   REFERRING DIAG: N81.10 (ICD-10-CM) - Cystocele, unspecified  THERAPY DIAG:  Muscle weakness (generalized)  Unspecified lack of coordination  Rationale for Evaluation and Treatment: Rehabilitation  ONSET DATE: 05/14/22  SUBJECTIVE:                                                                                                                                                                                           SUBJECTIVE STATEMENT: Pt states that she feels like everything is just starting to drop. She reports that MD reports that it is her bladder dropping. Pt's husband passed away a year ago. She is doing  physical therapy exercises on a regular basis.  Fluid intake: Yes: several bottles a day    PAIN:  Are you having pain? Yes NPRS scale: 5/10 Pain location:  pt has arthritis everywhere, but bil knees are the worst  Pain type: aching Pain description: constant   Aggravating factors: increased activity  Relieving factors: small amount of exercise  PRECAUTIONS: None  RED FLAGS: None   WEIGHT BEARING RESTRICTIONS: No  FALLS:  Has patient fallen in last 6 months? No  LIVING ENVIRONMENT: Lives with: lives with  their family Lives in: House/apartment   OCCUPATION: retired  PLOF: Independent  PATIENT GOALS: decrease vaginal pressure  PERTINENT HISTORY:  Severe arthritis, tubal ligation Sexual abuse: No  BOWEL MOVEMENT: Pain with bowel movement: No Type of bowel movement:Frequency 1x/day and Strain No Fully empty rectum: Yes: - Leakage: No Pads: Yes: see below Fiber supplement: No, but tries to eat regular fiber   URINATION: Pain with urination: Yes Fully empty bladder: Yes: but she feels like it is getting more difficult - she gets into bathroom and feels like it is hard to start Stream: Strong Urgency: Yes: will leak if she doesn't go immediately - very bad at night Frequency: 1x/night; every 3 hours during the day Leakage: Urge to void, Walking to the bathroom, and Coughing Pads: Yes: panty liners - 2 a day  INTERCOURSE: Not sexually active, no history of pain  PREGNANCY: Vaginal deliveries 1 Tearing Yes: episiotomy C-section deliveries 0 Currently pregnant No  PROLAPSE:    OBJECTIVE:  09/12/22:  COGNITION: Overall cognitive status: Within functional limits for tasks assessed     SENSATION: Light touch: Appears intact Proprioception: Appears intact  FUNCTIONAL TESTS:  Breath holding and increased abdominal pressure with transfers and bed mobility Poor mechanics with bed mobility due to decrease in mobility Severe limitations with hip abduction  in any position  GAIT: Comments: bil canes, bil toe walker, Rt antalgic gait pattern  POSTURE: rounded shoulders, forward head, decreased lumbar lordosis, increased thoracic kyphosis, posterior pelvic tilt, flexed trunk , and increased bil hip flexion, increased bil knee flexion   PALPATION:   General  no abdominal tenderness                External Perineal Exam observation of anterior vaginal wall/bladder prolapse                             Internal Pelvic Floor no pain, low tone  Patient confirms identification and approves PT to assess internal pelvic floor and treatment Yes  PELVIC MMT:   MMT eval  Vaginal 1/5, poor coordination - increasing pressure 50% of the time and pushing out, 5 repeat contractions, 5 seconds hold  Diastasis Recti Mild distortion, 1 finger width separation  (Blank rows = not tested)        TONE: low  PROLAPSE: Grade 3 anterior vaginal wall laxity noted  TODAY'S TREATMENT:                                                                                                                              DATE:  09/12/22  EVAL  Neuromuscular re-education: Pelvic floor muscle contractions Quick flicks Long holds Therapeutic activities: Working on increasing awareness of breath holding throughout daily activities and trying to exhale with effort - pressure management Vulvovaginal massage - keeping coconut oil by toilet and moisturizing each time    PATIENT EDUCATION:  Education details: See above Person  educated: Patient Education method: Explanation, Demonstration, Tactile cues, Verbal cues, and Handouts Education comprehension: verbalized understanding  HOME EXERCISE PROGRAM: [id number]  ASSESSMENT:  CLINICAL IMPRESSION: Patient is a 77 y.o. female who was seen today for physical therapy evaluation and treatment for vaginal bulge/pressure. Exam findings notable for abnormal posture and gait, poor pressure management with transfers and bed  mobility, pelvic floor muscle weakness and poor coordination of contraction, decreased pelvic floor endurance, and grade 3 anterior vaginal wall laxity. Signs and symptoms are most consistent with vaginal wall laxity, poor pressure management, and pelvic floor weakness. Initial treatment consisted of pelvic floor muscle contraction training, education on pressure management, and vulvovaginal massage to vaginal bulge. She will continue to benefit from skilled PT intervention in order to decrease grade of anterior vaginal wall laxity, improve pressure management, decrease urinary urgency/incontinence, and begin/progress functional strengthening program.   OBJECTIVE IMPAIRMENTS: decreased activity tolerance, decreased coordination, decreased endurance, decreased mobility, decreased strength, increased fascial restrictions, increased muscle spasms, impaired tone, postural dysfunction, and pain.   ACTIVITY LIMITATIONS: continence  PARTICIPATION LIMITATIONS: community activity  PERSONAL FACTORS: 1 comorbidity: medical history  are also affecting patient's functional outcome.   REHAB POTENTIAL: Good  CLINICAL DECISION MAKING: Stable/uncomplicated  EVALUATION COMPLEXITY: Low   GOALS: Goals reviewed with patient? Yes  SHORT TERM GOALS: Target date: 09/12/22  Pt will be independent with HEP.   Baseline: Goal status: INITIAL  2.  Pt will be independent with the knack, urge suppression technique, and double voiding in order to improve bladder habits and decrease urinary incontinence.   Baseline:  Goal status: INITIAL  3.  Pt will be able to correctly perform diaphragmatic breathing and appropriate pressure management in order to prevent worsening vaginal wall laxity and improve pelvic floor A/ROM.   Baseline:  Goal status: INITIAL    LONG TERM GOALS: Target date: 11/07/22  Pt will be independent with advanced HEP.   Baseline:  Goal status: INITIAL  2.  Pt will report no leaks with  laughing, coughing, sneezing in order to improve comfort with interpersonal relationships and community activities.   Baseline:  Goal status: INITIAL  3.  Pt will be able to go 2-3 hours in between voids without urgency or incontinence in order to improve QOL and perform all functional activities with less difficulty.   Baseline:  Goal status: INITIAL  4.  Pt will demonstrate normal pelvic floor muscle tone and A/ROM, able to achieve 3/5 strength with contractions and 15 sec endurance, in order to provide appropriate lumbopelvic support in functional activities.   Baseline:  Goal status: INITIAL  5.  Pt will be able to perform bed mobility and transfers without breath holding and appropriate ergonomics.  Baseline:  Goal status: INITIAL  PLAN:  PT FREQUENCY: 1-2x/week  PT DURATION: 8 weeks  PLANNED INTERVENTIONS: Therapeutic exercises, Therapeutic activity, Neuromuscular re-education, Balance training, Gait training, Patient/Family education, Self Care, Joint mobilization, Dry Needling, Biofeedback, and Manual therapy  PLAN FOR NEXT SESSION: Ask pt about pessary - has any provider mentioned this? Inverted lying - see if we can find comfortable position for patient that she can get into/out of safely; core strengthening activities.    Julio Alm, PT, DPT07/31/2410:27 AM

## 2022-09-12 NOTE — Patient Instructions (Signed)
Vulvar/vaginal Massage: This is a technique to help decrease painful sensitivity in the vaginal area. It can also help to restore normal moisture levels in the vaginal tissues. With coconut oil, aloe, jojoba oil, or a specific vaginal moisturizer, gently massage into vaginal tissues. Think of this as part of your post-shower routine and moisturizing just like you would the rest of the body with lotion. This helps to increase good blood flow to the vaginal tissues. In addition, it also teaches the body that touch to the vagina does not have to be painful or threatening, but moisturizing and gentle.   [facility] [address], [address] [place], [place] [address] Phone # [phone] Fax [phone]  

## 2022-09-17 ENCOUNTER — Other Ambulatory Visit: Payer: Self-pay | Admitting: Internal Medicine

## 2022-09-19 ENCOUNTER — Ambulatory Visit: Payer: Medicare PPO | Attending: Obstetrics and Gynecology

## 2022-09-19 DIAGNOSIS — R279 Unspecified lack of coordination: Secondary | ICD-10-CM | POA: Insufficient documentation

## 2022-09-19 DIAGNOSIS — M6281 Muscle weakness (generalized): Secondary | ICD-10-CM | POA: Insufficient documentation

## 2022-09-19 NOTE — Patient Instructions (Signed)
   The first picture shows that there is no effect on the pelvic floor with gravity eliminated. The next three show that with a wedge pillow or a few pillows from home under your pelvis the pelvic floor is inverted and may relax and allows gravity to help return prolapsed areas more inward to help relieve symptoms. Do this 15-20 mins every evening when symptoms tend to be worse. Stop if you have pain or negative symptoms.   [facility] [address] [place], [place] [address] Phone # [phone] Fax [phone]

## 2022-09-19 NOTE — Therapy (Signed)
OUTPATIENT PHYSICAL THERAPY FEMALE PELVIC EVALUATION   Patient Name: Rebecca Murray MRN: [id number] DOB:Jan 16, 1946, 77 y.o., female Today's Date: 09/19/2022  END OF SESSION:  PT End of Session - 09/19/22 0934     Visit Number 2    Date for PT Re-Evaluation 11/07/22    Authorization Type [employer]    Authorization Time Period 09/12/2022-02/27/2023    Authorization - Visit Number 1    Authorization - Number of Visits 12    PT Start Time 0930    PT Stop Time 1010    PT Time Calculation (min) 40 min    Activity Tolerance Patient tolerated treatment well    Behavior During Therapy WFL for tasks assessed/performed              Past Medical History:  Diagnosis Date   Allergic rhinitis    Allergy    grass, trees, environmental   Arthritis    Asthma    Hyperlipidemia    Osteoporosis    Post-operative nausea and vomiting    Thyroid disease    Past Surgical History:  Procedure Laterality Date   TUBAL LIGATION     WRIST SURGERY     Patient Active Problem List   Diagnosis Date Noted   Allergic rhinitis due to allergen 07/19/2022   GERD (gastroesophageal reflux disease) 01/12/2019   Asthma 01/12/2019   Allergy 01/12/2019   Secondary hypertension 07/15/2017   Essential hypertension 07/15/2017   Mild intermittent asthma without complication 10/21/2014   Seasonal and perennial allergic rhinitis 10/21/2014    PCP: Lewis Moccasin, MD  REFERRING PROVIDER: Marcelle Overlie, MD   REFERRING DIAG: N81.10 (ICD-10-CM) - Cystocele, unspecified  THERAPY DIAG:  Muscle weakness (generalized)  Unspecified lack of coordination  Rationale for Evaluation and Treatment: Rehabilitation  ONSET DATE: 05/14/22  SUBJECTIVE:                                                                                                                                                                                           SUBJECTIVE STATEMENT: Pt states that she has been doing pelvic floor  exercises daily. She feels like she is noticing some improvements, but she still feels bladder slide down. She has bought coconut oil and started using; believes it helps the tissues feel better.   PAIN:  Are you having pain? Yes NPRS scale: 5/10 Pain location:  pt has arthritis everywhere, but bil knees are the worst  Pain type: aching Pain description: constant   Aggravating factors: increased activity  Relieving factors: small amount of exercise  PRECAUTIONS: None  RED FLAGS: None   WEIGHT BEARING RESTRICTIONS: No  FALLS:  Has patient fallen in last 6 months? No  LIVING ENVIRONMENT: Lives with: lives with their family Lives in: House/apartment   OCCUPATION: retired  PLOF: Independent  PATIENT GOALS: decrease vaginal pressure  PERTINENT HISTORY:  Severe arthritis, tubal ligation Sexual abuse: No  BOWEL MOVEMENT: Pain with bowel movement: No Type of bowel movement:Frequency 1x/day and Strain No Fully empty rectum: Yes: - Leakage: No Pads: Yes: see below Fiber supplement: No, but tries to eat regular fiber   URINATION: Pain with urination: Yes Fully empty bladder: Yes: but she feels like it is getting more difficult - she gets into bathroom and feels like it is hard to start Stream: Strong Urgency: Yes: will leak if she doesn't go immediately - very bad at night Frequency: 1x/night; every 3 hours during the day Leakage: Urge to void, Walking to the bathroom, and Coughing Pads: Yes: panty liners - 2 a day  INTERCOURSE: Not sexually active, no history of pain  PREGNANCY: Vaginal deliveries 1 Tearing Yes: episiotomy C-section deliveries 0 Currently pregnant No  PROLAPSE:    OBJECTIVE:  09/12/22:  COGNITION: Overall cognitive status: Within functional limits for tasks assessed     SENSATION: Light touch: Appears intact Proprioception: Appears intact  FUNCTIONAL TESTS:  Breath holding and increased abdominal pressure with transfers and bed  mobility Poor mechanics with bed mobility due to decrease in mobility Severe limitations with hip abduction in any position  GAIT: Comments: bil canes, bil toe walker, Rt antalgic gait pattern  POSTURE: rounded shoulders, forward head, decreased lumbar lordosis, increased thoracic kyphosis, posterior pelvic tilt, flexed trunk , and increased bil hip flexion, increased bil knee flexion   PALPATION:   General  no abdominal tenderness                External Perineal Exam observation of anterior vaginal wall/bladder prolapse                             Internal Pelvic Floor no pain, low tone  Patient confirms identification and approves PT to assess internal pelvic floor and treatment Yes  PELVIC MMT:   MMT eval  Vaginal 1/5, poor coordination - increasing pressure 50% of the time and pushing out, 5 repeat contractions, 5 seconds hold  Diastasis Recti Mild distortion, 1 finger width separation  (Blank rows = not tested)        TONE: low  PROLAPSE: Grade 3 anterior vaginal wall laxity noted  TODAY'S TREATMENT:                                                                                                                              DATE:  09/19/22 Neuromuscular re-education: Transversus abdominus training with multimodal cues for improved motor control and breath coordination Bil UE ball press with core activation 15x Supine hip adduction with core activation 15x Therapeutic activities: Discussed possible benefit form pessary Inverted lying position  options  Pressure management with bed mobility   09/12/22  EVAL  Neuromuscular re-education: Pelvic floor muscle contractions Quick flicks Long holds Therapeutic activities: Working on increasing awareness of breath holding throughout daily activities and trying to exhale with effort - pressure management Vulvovaginal massage - keeping coconut oil by toilet and moisturizing each time    PATIENT EDUCATION:  Education  details: See above Person educated: Patient Education method: Explanation, Demonstration, Tactile cues, Verbal cues, and Handouts Education comprehension: verbalized understanding  HOME EXERCISE PROGRAM: [id number]  ASSESSMENT:  CLINICAL IMPRESSION: Pt was able to practice getting into inverted lying position options. Believe that just lying supine with support pillows under hips is the best option due to having to increase abdominal pressure so much getting into full inverted lying position on bed. We started discussing pessary and that it may be a good option if she is still bothered by her vaginal pressure symptoms after pelvic floor physical therapy. She did very well with core training and was able to progress into more challenging activities. She will continue to benefit from skilled PT intervention in order to decrease grade of anterior vaginal wall laxity, improve pressure management, decrease urinary urgency/incontinence, and begin/progress functional strengthening program.   OBJECTIVE IMPAIRMENTS: decreased activity tolerance, decreased coordination, decreased endurance, decreased mobility, decreased strength, increased fascial restrictions, increased muscle spasms, impaired tone, postural dysfunction, and pain.   ACTIVITY LIMITATIONS: continence  PARTICIPATION LIMITATIONS: community activity  PERSONAL FACTORS: 1 comorbidity: medical history  are also affecting patient's functional outcome.   REHAB POTENTIAL: Good  CLINICAL DECISION MAKING: Stable/uncomplicated  EVALUATION COMPLEXITY: Low   GOALS: Goals reviewed with patient? Yes  SHORT TERM GOALS: Target date: 09/12/22  Pt will be independent with HEP.   Baseline: Goal status: INITIAL  2.  Pt will be independent with the knack, urge suppression technique, and double voiding in order to improve bladder habits and decrease urinary incontinence.   Baseline:  Goal status: INITIAL  3.  Pt will be able to correctly  perform diaphragmatic breathing and appropriate pressure management in order to prevent worsening vaginal wall laxity and improve pelvic floor A/ROM.   Baseline:  Goal status: INITIAL    LONG TERM GOALS: Target date: 11/07/22  Pt will be independent with advanced HEP.   Baseline:  Goal status: INITIAL  2.  Pt will report no leaks with laughing, coughing, sneezing in order to improve comfort with interpersonal relationships and community activities.   Baseline:  Goal status: INITIAL  3.  Pt will be able to go 2-3 hours in between voids without urgency or incontinence in order to improve QOL and perform all functional activities with less difficulty.   Baseline:  Goal status: INITIAL  4.  Pt will demonstrate normal pelvic floor muscle tone and A/ROM, able to achieve 3/5 strength with contractions and 15 sec endurance, in order to provide appropriate lumbopelvic support in functional activities.   Baseline:  Goal status: INITIAL  5.  Pt will be able to perform bed mobility and transfers without breath holding and appropriate ergonomics.  Baseline:  Goal status: INITIAL  PLAN:  PT FREQUENCY: 1-2x/week  PT DURATION: 8 weeks  PLANNED INTERVENTIONS: Therapeutic exercises, Therapeutic activity, Neuromuscular re-education, Balance training, Gait training, Patient/Family education, Self Care, Joint mobilization, Dry Needling, Biofeedback, and Manual therapy  PLAN FOR NEXT SESSION: core strengthening activities   Julio Alm, PT, DPT08/08/2408:06 AM

## 2022-10-03 NOTE — Therapy (Signed)
OUTPATIENT PHYSICAL THERAPY FEMALE PELVIC EVALUATION   Patient Name: Rebecca Murray MRN: [id number] DOB:[birth year], 77 y.o., female Today's Date: 10/04/2022  END OF SESSION:  PT End of Session - 10/04/22 1026     Visit Number 3    Date for PT Re-Evaluation 11/07/22    Authorization Type [employer]    Authorization Time Period 09/12/2022-02/27/2023    Authorization - Visit Number 2    Authorization - Number of Visits 12    PT Start Time 1025    PT Stop Time 1055    PT Time Calculation (min) 30 min    Activity Tolerance Patient tolerated treatment well    Behavior During Therapy WFL for tasks assessed/performed               Past Medical History:  Diagnosis Date   Allergic rhinitis    Allergy    grass, trees, environmental   Arthritis    Asthma    Hyperlipidemia    Osteoporosis    Post-operative nausea and vomiting    Thyroid disease    Past Surgical History:  Procedure Laterality Date   TUBAL LIGATION     WRIST SURGERY     Patient Active Problem List   Diagnosis Date Noted   Allergic rhinitis due to allergen 07/19/2022   GERD (gastroesophageal reflux disease) 01/12/2019   Asthma 01/12/2019   Allergy 01/12/2019   Secondary hypertension 07/15/2017   Essential hypertension 07/15/2017   Mild intermittent asthma without complication 10/21/2014   Seasonal and perennial allergic rhinitis 10/21/2014    PCP: Lewis Moccasin, MD  REFERRING PROVIDER: Marcelle Overlie, MD   REFERRING DIAG: N81.10 (ICD-10-CM) - Cystocele, unspecified  THERAPY DIAG:  Muscle weakness (generalized)  Unspecified lack of coordination  Rationale for Evaluation and Treatment: Rehabilitation  ONSET DATE: 05/14/22  SUBJECTIVE:                                                                                                                                                                                           SUBJECTIVE STATEMENT: Pt states that she has been working on pelvic  floor strengthening program and inverted lying. She statesthat when she gets up in the middle of the night she is not having to run to the bathroom and will not leak on the way. She still feels like she notices bladder trying to come out when she's standing.   PAIN:  Are you having pain? Yes NPRS scale: 5/10 Pain location:  pt has arthritis everywhere, but bil knees are the worst  Pain type: aching Pain description: constant   Aggravating factors: increased activity  Relieving factors: small  amount of exercise  PRECAUTIONS: None  RED FLAGS: None   WEIGHT BEARING RESTRICTIONS: No  FALLS:  Has patient fallen in last 6 months? No  LIVING ENVIRONMENT: Lives with: lives with their family Lives in: House/apartment   OCCUPATION: retired  PLOF: Independent  PATIENT GOALS: decrease vaginal pressure  PERTINENT HISTORY:  Severe arthritis, tubal ligation Sexual abuse: No  BOWEL MOVEMENT: Pain with bowel movement: No Type of bowel movement:Frequency 1x/day and Strain No Fully empty rectum: Yes: - Leakage: No Pads: Yes: see below Fiber supplement: No, but tries to eat regular fiber   URINATION: Pain with urination: Yes Fully empty bladder: Yes: but she feels like it is getting more difficult - she gets into bathroom and feels like it is hard to start Stream: Strong Urgency: Yes: will leak if she doesn't go immediately - very bad at night Frequency: 1x/night; every 3 hours during the day Leakage: Urge to void, Walking to the bathroom, and Coughing Pads: Yes: panty liners - 2 a day  INTERCOURSE: Not sexually active, no history of pain  PREGNANCY: Vaginal deliveries 1 Tearing Yes: episiotomy C-section deliveries 0 Currently pregnant No  PROLAPSE:    OBJECTIVE:  09/12/22:  COGNITION: Overall cognitive status: Within functional limits for tasks assessed     SENSATION: Light touch: Appears intact Proprioception: Appears intact  FUNCTIONAL TESTS:  Breath  holding and increased abdominal pressure with transfers and bed mobility Poor mechanics with bed mobility due to decrease in mobility Severe limitations with hip abduction in any position  GAIT: Comments: bil canes, bil toe walker, Rt antalgic gait pattern  POSTURE: rounded shoulders, forward head, decreased lumbar lordosis, increased thoracic kyphosis, posterior pelvic tilt, flexed trunk , and increased bil hip flexion, increased bil knee flexion   PALPATION:   General  no abdominal tenderness                External Perineal Exam observation of anterior vaginal wall/bladder prolapse                             Internal Pelvic Floor no pain, low tone  Patient confirms identification and approves PT to assess internal pelvic floor and treatment Yes  PELVIC MMT:   MMT eval  Vaginal 1/5, poor coordination - increasing pressure 50% of the time and pushing out, 5 repeat contractions, 5 seconds hold  Diastasis Recti Mild distortion, 1 finger width separation  (Blank rows = not tested)        TONE: low  PROLAPSE: Grade 3 anterior vaginal wall laxity noted  TODAY'S TREATMENT:                                                                                                                              DATE:  10/04/22 Neuromuscular re-education: Seated bil UE ball press 2 x 10 Seated hip adduction with block 2 x 10 Seated hip abduction with green  band 2 x 10 Seated unilateral shoulder extension in rotation red band 2 x 10 bil Seated unilateral overhead press 3lbs 2 x 10 bil   09/19/22 Neuromuscular re-education: Transversus abdominus training with multimodal cues for improved motor control and breath coordination Bil UE ball press with core activation 15x Supine hip adduction with core activation 15x Therapeutic activities: Discussed possible benefit form pessary Inverted lying position options  Pressure management with bed mobility   09/12/22  EVAL  Neuromuscular  re-education: Pelvic floor muscle contractions Quick flicks Long holds Therapeutic activities: Working on increasing awareness of breath holding throughout daily activities and trying to exhale with effort - pressure management Vulvovaginal massage - keeping coconut oil by toilet and moisturizing each time    PATIENT EDUCATION:  Education details: See above Person educated: Patient Education method: Programmer, multimedia, Demonstration, Tactile cues, Verbal cues, and Handouts Education comprehension: verbalized understanding  HOME EXERCISE PROGRAM: [id number]  ASSESSMENT:  CLINICAL IMPRESSION: Pt starting to see more progress with bladder control when she wakes in the middle of the night, not leaking on the way to the bathroom or having severe urgency. She is still feeling vaginal pressure when standing. She did well with seated exercises, but did require chair with back support due to limited mobility in hips and poor core strength. She did very well with all seated exercises and able to feel appropriate core/pelvic floor contraction.  She will continue to benefit from skilled PT intervention in order to decrease grade of anterior vaginal wall laxity, improve pressure management, decrease urinary urgency/incontinence, and begin/progress functional strengthening program.   OBJECTIVE IMPAIRMENTS: decreased activity tolerance, decreased coordination, decreased endurance, decreased mobility, decreased strength, increased fascial restrictions, increased muscle spasms, impaired tone, postural dysfunction, and pain.   ACTIVITY LIMITATIONS: continence  PARTICIPATION LIMITATIONS: community activity  PERSONAL FACTORS: 1 comorbidity: medical history  are also affecting patient's functional outcome.   REHAB POTENTIAL: Good  CLINICAL DECISION MAKING: Stable/uncomplicated  EVALUATION COMPLEXITY: Low   GOALS: Goals reviewed with patient? Yes  SHORT TERM GOALS: Target date: 09/12/22 - updated  10/04/22  Pt will be independent with HEP.   Baseline: Goal status: MET 10/04/22  2.  Pt will be independent with the knack, urge suppression technique, and double voiding in order to improve bladder habits and decrease urinary incontinence.   Baseline:  Goal status: IN PROGRESS  3.  Pt will be able to correctly perform diaphragmatic breathing and appropriate pressure management in order to prevent worsening vaginal wall laxity and improve pelvic floor A/ROM.   Baseline:  Goal status:IN PROGRESS    LONG TERM GOALS: Target date: 11/07/22 - updated 10/04/22  Pt will be independent with advanced HEP.   Baseline:  Goal status: IN PROGRESS  2.  Pt will report no leaks with laughing, coughing, sneezing in order to improve comfort with interpersonal relationships and community activities.   Baseline:  Goal status: IN PROGRESS  3.  Pt will be able to go 2-3 hours in between voids without urgency or incontinence in order to improve QOL and perform all functional activities with less difficulty.   Baseline:  Goal status: IN PROGRESS  4.  Pt will demonstrate normal pelvic floor muscle tone and A/ROM, able to achieve 3/5 strength with contractions and 15 sec endurance, in order to provide appropriate lumbopelvic support in functional activities.   Baseline:  Goal status: IN PROGRESS  5.  Pt will be able to perform bed mobility and transfers without breath holding and appropriate ergonomics.  Baseline:  Goal status: IN PROGRESS  PLAN:  PT FREQUENCY: 1-2x/week  PT DURATION: 8 weeks  PLANNED INTERVENTIONS: Therapeutic exercises, Therapeutic activity, Neuromuscular re-education, Balance training, Gait training, Patient/Family education, Self Care, Joint mobilization, Dry Needling, Biofeedback, and Manual therapy  PLAN FOR NEXT SESSION: review and progress core strengthening activities with pressure management   Julio Alm, PT, DPT08/22/2410:48 AM

## 2022-10-04 ENCOUNTER — Ambulatory Visit: Payer: Medicare PPO

## 2022-10-04 DIAGNOSIS — R279 Unspecified lack of coordination: Secondary | ICD-10-CM | POA: Diagnosis not present

## 2022-10-04 DIAGNOSIS — M6281 Muscle weakness (generalized): Secondary | ICD-10-CM

## 2022-10-08 ENCOUNTER — Ambulatory Visit: Payer: Medicare PPO

## 2022-10-08 DIAGNOSIS — R279 Unspecified lack of coordination: Secondary | ICD-10-CM

## 2022-10-08 DIAGNOSIS — M6281 Muscle weakness (generalized): Secondary | ICD-10-CM | POA: Diagnosis not present

## 2022-10-08 NOTE — Therapy (Signed)
OUTPATIENT PHYSICAL THERAPY FEMALE PELVIC EVALUATION   Patient Name: Rebecca Murray MRN: [id number] DOB:18-Sep-1945, 77 y.o., female Today's Date: 10/08/2022  END OF SESSION:  PT End of Session - 10/08/22 0927     Visit Number 4    Date for PT Re-Evaluation 11/07/22    Authorization Type [employer]    Authorization Time Period 09/12/2022-02/27/2023    Authorization - Visit Number 3    Authorization - Number of Visits 12    PT Start Time 0930    PT Stop Time 1010    PT Time Calculation (min) 40 min    Activity Tolerance Patient tolerated treatment well    Behavior During Therapy WFL for tasks assessed/performed                Past Medical History:  Diagnosis Date   Allergic rhinitis    Allergy    grass, trees, environmental   Arthritis    Asthma    Hyperlipidemia    Osteoporosis    Post-operative nausea and vomiting    Thyroid disease    Past Surgical History:  Procedure Laterality Date   TUBAL LIGATION     WRIST SURGERY     Patient Active Problem List   Diagnosis Date Noted   Allergic rhinitis due to allergen 07/19/2022   GERD (gastroesophageal reflux disease) 01/12/2019   Asthma 01/12/2019   Allergy 01/12/2019   Secondary hypertension 07/15/2017   Essential hypertension 07/15/2017   Mild intermittent asthma without complication 10/21/2014   Seasonal and perennial allergic rhinitis 10/21/2014    PCP: Lewis Moccasin, MD  REFERRING PROVIDER: Marcelle Overlie, MD   REFERRING DIAG: N81.10 (ICD-10-CM) - Cystocele, unspecified  THERAPY DIAG:  Muscle weakness (generalized)  Unspecified lack of coordination  Rationale for Evaluation and Treatment: Rehabilitation  ONSET DATE: 05/14/22  SUBJECTIVE:                                                                                                                                                                                           SUBJECTIVE STATEMENT: Pt states that she is not feeling any vaignal  pressure. Thre are times that she feels the bladder fall a little bit.    PAIN:  Are you having pain? Yes NPRS scale: 5/10 Pain location:  pt has arthritis everywhere, but bil knees are the worst  Pain type: aching Pain description: constant   Aggravating factors: increased activity  Relieving factors: small amount of exercise  PRECAUTIONS: None  RED FLAGS: None   WEIGHT BEARING RESTRICTIONS: No  FALLS:  Has patient fallen in last 6 months? No  LIVING ENVIRONMENT: Lives with: lives  with their family Lives in: House/apartment   OCCUPATION: retired  PLOF: Independent  PATIENT GOALS: decrease vaginal pressure  PERTINENT HISTORY:  Severe arthritis, tubal ligation Sexual abuse: No  BOWEL MOVEMENT: Pain with bowel movement: No Type of bowel movement:Frequency 1x/day and Strain No Fully empty rectum: Yes: - Leakage: No Pads: Yes: see below Fiber supplement: No, but tries to eat regular fiber   URINATION: Pain with urination: Yes Fully empty bladder: Yes: but she feels like it is getting more difficult - she gets into bathroom and feels like it is hard to start Stream: Strong Urgency: Yes: will leak if she doesn't go immediately - very bad at night Frequency: 1x/night; every 3 hours during the day Leakage: Urge to void, Walking to the bathroom, and Coughing Pads: Yes: panty liners - 2 a day  INTERCOURSE: Not sexually active, no history of pain  PREGNANCY: Vaginal deliveries 1 Tearing Yes: episiotomy C-section deliveries 0 Currently pregnant No  PROLAPSE:    OBJECTIVE:  09/12/22:  COGNITION: Overall cognitive status: Within functional limits for tasks assessed     SENSATION: Light touch: Appears intact Proprioception: Appears intact  FUNCTIONAL TESTS:  Breath holding and increased abdominal pressure with transfers and bed mobility Poor mechanics with bed mobility due to decrease in mobility Severe limitations with hip abduction in any  position  GAIT: Comments: bil canes, bil toe walker, Rt antalgic gait pattern  POSTURE: rounded shoulders, forward head, decreased lumbar lordosis, increased thoracic kyphosis, posterior pelvic tilt, flexed trunk , and increased bil hip flexion, increased bil knee flexion   PALPATION:   General  no abdominal tenderness                External Perineal Exam observation of anterior vaginal wall/bladder prolapse                             Internal Pelvic Floor no pain, low tone  Patient confirms identification and approves PT to assess internal pelvic floor and treatment Yes  PELVIC MMT:   MMT eval  Vaginal 1/5, poor coordination - increasing pressure 50% of the time and pushing out, 5 repeat contractions, 5 seconds hold  Diastasis Recti Mild distortion, 1 finger width separation  (Blank rows = not tested)        TONE: low  PROLAPSE: Grade 3 anterior vaginal wall laxity noted  TODAY'S TREATMENT:                                                                                                                              DATE:  10/08/22 Neuromuscular re-education: Seated hip adduction with block 2 x 10 Seated hip abduction with black band 2 x 10 Seated unilateral shoulder extension in rotation green band 2 x 10 bil Seated unilateral overhead press 3lbs 2 x 10 bil Bil UE shoulder extension with core activation 2 x 10 green band    10/04/22  Neuromuscular re-education: Seated bil UE ball press 2 x 10 Seated hip adduction with block 2 x 10 Seated hip abduction with green band 2 x 10 Seated unilateral shoulder extension in rotation red band 2 x 10 bil Seated unilateral overhead press 3lbs 2 x 10 bil   09/19/22 Neuromuscular re-education: Transversus abdominus training with multimodal cues for improved motor control and breath coordination Bil UE ball press with core activation 15x Supine hip adduction with core activation 15x Therapeutic activities: Discussed possible  benefit form pessary Inverted lying position options  Pressure management with bed mobility   PATIENT EDUCATION:  Education details: See above Person educated: Patient Education method: Explanation, Demonstration, Tactile cues, Verbal cues, and Handouts Education comprehension: verbalized understanding  HOME EXERCISE PROGRAM: [id number]  ASSESSMENT:  CLINICAL IMPRESSION: Patient doing very well with continued reduction in sensation of vaginal pressure/heaviness. She is finding better core activation with all exercises and performing with appropriate breath control. She tolerated addition of anterior weight lift and shoulder extensions with core facilitation very well, but does fatigue quickly requiring rest breaks. She will continue to benefit from skilled PT intervention in order to decrease grade of anterior vaginal wall laxity, improve pressure management, decrease urinary urgency/incontinence, and begin/progress functional strengthening program.   OBJECTIVE IMPAIRMENTS: decreased activity tolerance, decreased coordination, decreased endurance, decreased mobility, decreased strength, increased fascial restrictions, increased muscle spasms, impaired tone, postural dysfunction, and pain.   ACTIVITY LIMITATIONS: continence  PARTICIPATION LIMITATIONS: community activity  PERSONAL FACTORS: 1 comorbidity: medical history  are also affecting patient's functional outcome.   REHAB POTENTIAL: Good  CLINICAL DECISION MAKING: Stable/uncomplicated  EVALUATION COMPLEXITY: Low   GOALS: Goals reviewed with patient? Yes  SHORT TERM GOALS: Target date: 09/12/22 - updated 10/04/22  Pt will be independent with HEP.   Baseline: Goal status: MET 10/04/22  2.  Pt will be independent with the knack, urge suppression technique, and double voiding in order to improve bladder habits and decrease urinary incontinence.   Baseline:  Goal status: IN PROGRESS  3.  Pt will be able to correctly perform  diaphragmatic breathing and appropriate pressure management in order to prevent worsening vaginal wall laxity and improve pelvic floor A/ROM.   Baseline:  Goal status:IN PROGRESS    LONG TERM GOALS: Target date: 11/07/22 - updated 10/04/22  Pt will be independent with advanced HEP.   Baseline:  Goal status: IN PROGRESS  2.  Pt will report no leaks with laughing, coughing, sneezing in order to improve comfort with interpersonal relationships and community activities.   Baseline:  Goal status: IN PROGRESS  3.  Pt will be able to go 2-3 hours in between voids without urgency or incontinence in order to improve QOL and perform all functional activities with less difficulty.   Baseline:  Goal status: IN PROGRESS  4.  Pt will demonstrate normal pelvic floor muscle tone and A/ROM, able to achieve 3/5 strength with contractions and 15 sec endurance, in order to provide appropriate lumbopelvic support in functional activities.   Baseline:  Goal status: IN PROGRESS  5.  Pt will be able to perform bed mobility and transfers without breath holding and appropriate ergonomics.  Baseline:  Goal status: IN PROGRESS  PLAN:  PT FREQUENCY: 1-2x/week  PT DURATION: 8 weeks  PLANNED INTERVENTIONS: Therapeutic exercises, Therapeutic activity, Neuromuscular re-education, Balance training, Gait training, Patient/Family education, Self Care, Joint mobilization, Dry Needling, Biofeedback, and Manual therapy  PLAN FOR NEXT SESSION: review and progress core strengthening activities with pressure management  Julio Alm, PT, DPT08/26/249:28 AM

## 2022-10-23 ENCOUNTER — Ambulatory Visit: Payer: Medicare PPO | Attending: Obstetrics and Gynecology

## 2022-10-23 DIAGNOSIS — R279 Unspecified lack of coordination: Secondary | ICD-10-CM | POA: Diagnosis not present

## 2022-10-23 DIAGNOSIS — M6281 Muscle weakness (generalized): Secondary | ICD-10-CM | POA: Diagnosis not present

## 2022-10-23 NOTE — Therapy (Signed)
OUTPATIENT PHYSICAL THERAPY FEMALE PELVIC EVALUATION   Patient Name: Rebecca Murray MRN: [id number] DOB:[birth year], 77 y.o., female Today's Date: 10/23/2022  END OF SESSION:  PT End of Session - 10/23/22 1529     Visit Number 5    Date for PT Re-Evaluation 11/07/22    Authorization Type [employer]    Authorization Time Period 09/12/2022-02/27/2023    Authorization - Visit Number 4    Authorization - Number of Visits 12    PT Start Time 1530    PT Stop Time 1610    PT Time Calculation (min) 40 min    Activity Tolerance Patient tolerated treatment well    Behavior During Therapy WFL for tasks assessed/performed                 Past Medical History:  Diagnosis Date   Allergic rhinitis    Allergy    grass, trees, environmental   Arthritis    Asthma    Hyperlipidemia    Osteoporosis    Post-operative nausea and vomiting    Thyroid disease    Past Surgical History:  Procedure Laterality Date   TUBAL LIGATION     WRIST SURGERY     Patient Active Problem List   Diagnosis Date Noted   Allergic rhinitis due to allergen 07/19/2022   GERD (gastroesophageal reflux disease) 01/12/2019   Asthma 01/12/2019   Allergy 01/12/2019   Secondary hypertension 07/15/2017   Essential hypertension 07/15/2017   Mild intermittent asthma without complication 10/21/2014   Seasonal and perennial allergic rhinitis 10/21/2014    PCP: Lewis Moccasin, MD  REFERRING PROVIDER: Marcelle Overlie, MD   REFERRING DIAG: N81.10 (ICD-10-CM) - Cystocele, unspecified  THERAPY DIAG:  Muscle weakness (generalized)  Unspecified lack of coordination  Rationale for Evaluation and Treatment: Rehabilitation  ONSET DATE: 05/14/22  SUBJECTIVE:                                                                                                                                                                                           SUBJECTIVE STATEMENT: Pt states that when she was reducing bladder  last night she noticed blood on the toilet paper. She states that it was a small amount of blood that stopped quickly. Pt states that she is feeling like her lower abdominal muscles are getting tighter.    PAIN:  Are you having pain? Yes NPRS scale: 5/10 Pain location:  pt has arthritis everywhere, but bil knees are the worst  Pain type: aching Pain description: constant   Aggravating factors: increased activity  Relieving factors: small amount of exercise  PRECAUTIONS: None  RED FLAGS: None  WEIGHT BEARING RESTRICTIONS: No  FALLS:  Has patient fallen in last 6 months? No  LIVING ENVIRONMENT: Lives with: lives with their family Lives in: House/apartment   OCCUPATION: retired  PLOF: Independent  PATIENT GOALS: decrease vaginal pressure  PERTINENT HISTORY:  Severe arthritis, tubal ligation Sexual abuse: No  BOWEL MOVEMENT: Pain with bowel movement: No Type of bowel movement:Frequency 1x/day and Strain No Fully empty rectum: Yes: - Leakage: No Pads: Yes: see below Fiber supplement: No, but tries to eat regular fiber   URINATION: Pain with urination: Yes Fully empty bladder: Yes: but she feels like it is getting more difficult - she gets into bathroom and feels like it is hard to start Stream: Strong Urgency: Yes: will leak if she doesn't go immediately - very bad at night Frequency: 1x/night; every 3 hours during the day Leakage: Urge to void, Walking to the bathroom, and Coughing Pads: Yes: panty liners - 2 a day  INTERCOURSE: Not sexually active, no history of pain  PREGNANCY: Vaginal deliveries 1 Tearing Yes: episiotomy C-section deliveries 0 Currently pregnant No  PROLAPSE:    OBJECTIVE:  09/12/22:  COGNITION: Overall cognitive status: Within functional limits for tasks assessed     SENSATION: Light touch: Appears intact Proprioception: Appears intact  FUNCTIONAL TESTS:  Breath holding and increased abdominal pressure with transfers and  bed mobility Poor mechanics with bed mobility due to decrease in mobility Severe limitations with hip abduction in any position  GAIT: Comments: bil canes, bil toe walker, Rt antalgic gait pattern  POSTURE: rounded shoulders, forward head, decreased lumbar lordosis, increased thoracic kyphosis, posterior pelvic tilt, flexed trunk , and increased bil hip flexion, increased bil knee flexion   PALPATION:   General  no abdominal tenderness                External Perineal Exam observation of anterior vaginal wall/bladder prolapse                             Internal Pelvic Floor no pain, low tone  Patient confirms identification and approves PT to assess internal pelvic floor and treatment Yes  PELVIC MMT:   MMT eval  Vaginal 1/5, poor coordination - increasing pressure 50% of the time and pushing out, 5 repeat contractions, 5 seconds hold  Diastasis Recti Mild distortion, 1 finger width separation  (Blank rows = not tested)        TONE: low  PROLAPSE: Grade 3 anterior vaginal wall laxity noted  TODAY'S TREATMENT:                                                                                                                              DATE:  10/23/22 Neuromuscular re-education: Heel/toe raises 3 x 10 Shoulder flexion 3lbs 3 x 10 Seated chop 3 x 10 bil 3lb weight, block for adduction isometric  Seated unilateral shoulder extension in rotation blue band 2  x 10 bil Diaphragmatic breathing with focus on abdominal expansion  10/08/22 Neuromuscular re-education: Seated hip adduction with block 2 x 10 Seated hip abduction with black band 2 x 10 Seated unilateral shoulder extension in rotation green band 2 x 10 bil Seated unilateral overhead press 3lbs 2 x 10 bil Bil UE shoulder extension with core activation 2 x 10 green band    10/04/22 Neuromuscular re-education: Seated bil UE ball press 2 x 10 Seated hip adduction with block 2 x 10 Seated hip abduction with green band  2 x 10 Seated unilateral shoulder extension in rotation red band 2 x 10 bil Seated unilateral overhead press 3lbs 2 x 10 bil  PATIENT EDUCATION:  Education details: See above Person educated: Patient Education method: Explanation, Demonstration, Tactile cues, Verbal cues, and Handouts Education comprehension: verbalized understanding  HOME EXERCISE PROGRAM: [id number]  ASSESSMENT:  CLINICAL IMPRESSION: Pt states that she is doing better. She did have some vaginal bleeding last night; per patient story, it sounds like vaginal wall irritation and possibly a finger nail scratch as she was trying to reduce bladder. However, she was encouraged to call MD just so they have record of symptoms and to continue to monitor to make sure there is no more bleeding. She did very well with all seated exercise progressions. She was able to challenge herself with slightly heavier weights/resistance in some exercises. She will continue to benefit from skilled PT intervention in order to decrease grade of anterior vaginal wall laxity, improve pressure management, decrease urinary urgency/incontinence, and begin/progress functional strengthening program.   OBJECTIVE IMPAIRMENTS: decreased activity tolerance, decreased coordination, decreased endurance, decreased mobility, decreased strength, increased fascial restrictions, increased muscle spasms, impaired tone, postural dysfunction, and pain.   ACTIVITY LIMITATIONS: continence  PARTICIPATION LIMITATIONS: community activity  PERSONAL FACTORS: 1 comorbidity: medical history  are also affecting patient's functional outcome.   REHAB POTENTIAL: Good  CLINICAL DECISION MAKING: Stable/uncomplicated  EVALUATION COMPLEXITY: Low   GOALS: Goals reviewed with patient? Yes  SHORT TERM GOALS: Target date: 09/12/22 - updated 10/04/22  Pt will be independent with HEP.   Baseline: Goal status: MET 10/04/22  2.  Pt will be independent with the knack, urge  suppression technique, and double voiding in order to improve bladder habits and decrease urinary incontinence.   Baseline:  Goal status: IN PROGRESS  3.  Pt will be able to correctly perform diaphragmatic breathing and appropriate pressure management in order to prevent worsening vaginal wall laxity and improve pelvic floor A/ROM.   Baseline:  Goal status:IN PROGRESS    LONG TERM GOALS: Target date: 11/07/22 - updated 10/04/22  Pt will be independent with advanced HEP.   Baseline:  Goal status: IN PROGRESS  2.  Pt will report no leaks with laughing, coughing, sneezing in order to improve comfort with interpersonal relationships and community activities.   Baseline:  Goal status: IN PROGRESS  3.  Pt will be able to go 2-3 hours in between voids without urgency or incontinence in order to improve QOL and perform all functional activities with less difficulty.   Baseline:  Goal status: IN PROGRESS  4.  Pt will demonstrate normal pelvic floor muscle tone and A/ROM, able to achieve 3/5 strength with contractions and 15 sec endurance, in order to provide appropriate lumbopelvic support in functional activities.   Baseline:  Goal status: IN PROGRESS  5.  Pt will be able to perform bed mobility and transfers without breath holding and appropriate ergonomics.  Baseline:  Goal status:  IN PROGRESS  PLAN:  PT FREQUENCY: 1-2x/week  PT DURATION: 8 weeks  PLANNED INTERVENTIONS: Therapeutic exercises, Therapeutic activity, Neuromuscular re-education, Balance training, Gait training, Patient/Family education, Self Care, Joint mobilization, Dry Needling, Biofeedback, and Manual therapy  PLAN FOR NEXT SESSION: review and progress core strengthening activities with pressure management   Julio Alm, PT, DPT09/10/244:09 PM

## 2022-10-30 ENCOUNTER — Ambulatory Visit: Payer: Medicare PPO

## 2022-10-30 DIAGNOSIS — R279 Unspecified lack of coordination: Secondary | ICD-10-CM

## 2022-10-30 DIAGNOSIS — M6281 Muscle weakness (generalized): Secondary | ICD-10-CM | POA: Diagnosis not present

## 2022-10-30 NOTE — Therapy (Signed)
OUTPATIENT PHYSICAL THERAPY FEMALE PELVIC EVALUATION   Patient Name: Rebecca Murray MRN: [id number] DOB:03-16-1945, 77 y.o., female Today's Date: 10/30/2022  END OF SESSION:  PT End of Session - 10/30/22 1059     Visit Number 6    Date for PT Re-Evaluation 11/07/22    Authorization Type [employer]    Authorization Time Period 09/12/2022-02/27/2023                 Past Medical History:  Diagnosis Date   Allergic rhinitis    Allergy    grass, trees, environmental   Arthritis    Asthma    Hyperlipidemia    Osteoporosis    Post-operative nausea and vomiting    Thyroid disease    Past Surgical History:  Procedure Laterality Date   TUBAL LIGATION     WRIST SURGERY     Patient Active Problem List   Diagnosis Date Noted   Allergic rhinitis due to allergen 07/19/2022   GERD (gastroesophageal reflux disease) 01/12/2019   Asthma 01/12/2019   Allergy 01/12/2019   Secondary hypertension 07/15/2017   Essential hypertension 07/15/2017   Mild intermittent asthma without complication 10/21/2014   Seasonal and perennial allergic rhinitis 10/21/2014    PCP: Lewis Moccasin, MD  REFERRING PROVIDER: Marcelle Overlie, MD   REFERRING DIAG: N81.10 (ICD-10-CM) - Cystocele, unspecified  THERAPY DIAG:  Muscle weakness (generalized)  Unspecified lack of coordination  Rationale for Evaluation and Treatment: Rehabilitation  ONSET DATE: 05/14/22  SUBJECTIVE:                                                                                                                                                                                           SUBJECTIVE STATEMENT: Pt states that she is feeling stronger and that lifting weights is helping her arms. She has not had any episodes of urinary incontinence and is going 3-4 hours between trips to the bathroom without urgency. She does still have difficulty starting flow of urine sometimes and completely emptying her bladder.    PAIN:   Are you having pain? Yes NPRS scale: 5/10 Pain location:  pt has arthritis everywhere, but bil knees are the worst  Pain type: aching Pain description: constant   Aggravating factors: increased activity  Relieving factors: small amount of exercise  PRECAUTIONS: None  RED FLAGS: None   WEIGHT BEARING RESTRICTIONS: No  FALLS:  Has patient fallen in last 6 months? No  LIVING ENVIRONMENT: Lives with: lives with their family Lives in: House/apartment   OCCUPATION: retired  PLOF: Independent  PATIENT GOALS: decrease vaginal pressure  PERTINENT HISTORY:  Severe arthritis, tubal ligation Sexual abuse:  No  BOWEL MOVEMENT: Pain with bowel movement: No Type of bowel movement:Frequency 1x/day and Strain No Fully empty rectum: Yes: - Leakage: No Pads: Yes: see below Fiber supplement: No, but tries to eat regular fiber   URINATION: Pain with urination: Yes Fully empty bladder: Yes: but she feels like it is getting more difficult - she gets into bathroom and feels like it is hard to start Stream: Strong Urgency: Yes: will leak if she doesn't go immediately - very bad at night Frequency: 1x/night; every 3 hours during the day Leakage: Urge to void, Walking to the bathroom, and Coughing Pads: Yes: panty liners - 2 a day  INTERCOURSE: Not sexually active, no history of pain  PREGNANCY: Vaginal deliveries 1 Tearing Yes: episiotomy C-section deliveries 0 Currently pregnant No  PROLAPSE:    OBJECTIVE:  09/12/22:  COGNITION: Overall cognitive status: Within functional limits for tasks assessed     SENSATION: Light touch: Appears intact Proprioception: Appears intact  FUNCTIONAL TESTS:  Breath holding and increased abdominal pressure with transfers and bed mobility Poor mechanics with bed mobility due to decrease in mobility Severe limitations with hip abduction in any position  GAIT: Comments: bil canes, bil toe walker, Rt antalgic gait pattern  POSTURE:  rounded shoulders, forward head, decreased lumbar lordosis, increased thoracic kyphosis, posterior pelvic tilt, flexed trunk , and increased bil hip flexion, increased bil knee flexion   PALPATION:   General  no abdominal tenderness                External Perineal Exam observation of anterior vaginal wall/bladder prolapse                             Internal Pelvic Floor no pain, low tone  Patient confirms identification and approves PT to assess internal pelvic floor and treatment Yes  PELVIC MMT:   MMT eval  Vaginal 1/5, poor coordination - increasing pressure 50% of the time and pushing out, 5 repeat contractions, 5 seconds hold  Diastasis Recti Mild distortion, 1 finger width separation  (Blank rows = not tested)        TONE: low  PROLAPSE: Grade 3 anterior vaginal wall laxity noted  TODAY'S TREATMENT:                                                                                                                              DATE:  10/30/22 Therapeutic activities: Double-voiding - education on vaginal wall laxity and bladder position Lying down more throughout the day HEP review/progression   10/23/22 Neuromuscular re-education: Heel/toe raises 3 x 10 Shoulder flexion 3lbs 3 x 10 Seated chop 3 x 10 bil 3lb weight, block for adduction isometric  Seated unilateral shoulder extension in rotation blue band 2 x 10 bil Diaphragmatic breathing with focus on abdominal expansion  10/08/22 Neuromuscular re-education: Seated hip adduction with block 2 x 10 Seated hip abduction with black  band 2 x 10 Seated unilateral shoulder extension in rotation green band 2 x 10 bil Seated unilateral overhead press 3lbs 2 x 10 bil Bil UE shoulder extension with core activation 2 x 10 green band   PATIENT EDUCATION:  Education details: See above Person educated: Patient Education method: Explanation, Demonstration, Tactile cues, Verbal cues, and Handouts Education comprehension:  verbalized understanding  HOME EXERCISE PROGRAM: [id number]  ASSESSMENT:  CLINICAL IMPRESSION: Pt has done very well in pelvic floor physical therapy. She is not having any episodes of urinary incontinence or urgency. She is still having some difficulty with emptying bladder, but we discussed double-voiding again and lying down throughout the day more in order to help allow bladder to reposition. We progressed Hep with exercises from last visit and reviewed previous progressions. Due to progress and having met all goals, she is prepared to D/C at this time.   OBJECTIVE IMPAIRMENTS: decreased activity tolerance, decreased coordination, decreased endurance, decreased mobility, decreased strength, increased fascial restrictions, increased muscle spasms, impaired tone, postural dysfunction, and pain.   ACTIVITY LIMITATIONS: continence  PARTICIPATION LIMITATIONS: community activity  PERSONAL FACTORS: 1 comorbidity: medical history  are also affecting patient's functional outcome.   REHAB POTENTIAL: Good  CLINICAL DECISION MAKING: Stable/uncomplicated  EVALUATION COMPLEXITY: Low   GOALS: Goals reviewed with patient? Yes  SHORT TERM GOALS: Target date: 09/12/22 - updated 10/04/22 - updated 10/30/22  Pt will be independent with HEP.   Baseline: Goal status: MET 10/04/22  2.  Pt will be independent with the knack, urge suppression technique, and double voiding in order to improve bladder habits and decrease urinary incontinence.   Baseline:  Goal status: MET 10/30/22  3.  Pt will be able to correctly perform diaphragmatic breathing and appropriate pressure management in order to prevent worsening vaginal wall laxity and improve pelvic floor A/ROM.   Baseline:  Goal status:  MET 10/30/22    LONG TERM GOALS: Target date: 11/07/22 - updated 10/04/22 - updated 10/30/22  Pt will be independent with advanced HEP.   Baseline:  Goal status: MET 10/30/22  2.  Pt will report no leaks with  laughing, coughing, sneezing in order to improve comfort with interpersonal relationships and community activities.   Baseline:  Goal status: MET 10/30/22  3.  Pt will be able to go 2-3 hours in between voids without urgency or incontinence in order to improve QOL and perform all functional activities with less difficulty.   Baseline:  Goal status: MET 10/30/22  4.  Pt will demonstrate normal pelvic floor muscle tone and A/ROM, able to achieve 3/5 strength with contractions and 15 sec endurance, in order to provide appropriate lumbopelvic support in functional activities.   Baseline:  Goal status: DISCHARGED  5.  Pt will be able to perform bed mobility and transfers without breath holding and appropriate ergonomics.  Baseline:  Goal status: MET 10/30/22  PLAN:  PT FREQUENCY: D/C  PT DURATION: D/C  PLANNED INTERVENTIONS: D/C  PLAN FOR NEXT SESSION: D/C  PHYSICAL THERAPY DISCHARGE SUMMARY  Visits from Start of Care: 6  Current functional level related to goals / functional outcomes: MET   Remaining deficits: See above   Education / Equipment: HEP   Patient agrees to discharge. Patient goals were met. Patient is being discharged due to meeting the stated rehab goals.  Julio Alm, PT, DPT09/17/2410:59 AM

## 2022-11-01 DIAGNOSIS — E785 Hyperlipidemia, unspecified: Secondary | ICD-10-CM | POA: Diagnosis not present

## 2022-11-01 DIAGNOSIS — I10 Essential (primary) hypertension: Secondary | ICD-10-CM | POA: Diagnosis not present

## 2022-11-08 DIAGNOSIS — M170 Bilateral primary osteoarthritis of knee: Secondary | ICD-10-CM | POA: Diagnosis not present

## 2022-11-08 DIAGNOSIS — Z23 Encounter for immunization: Secondary | ICD-10-CM | POA: Diagnosis not present

## 2022-11-08 DIAGNOSIS — I10 Essential (primary) hypertension: Secondary | ICD-10-CM | POA: Diagnosis not present

## 2022-11-08 DIAGNOSIS — G720 Drug-induced myopathy: Secondary | ICD-10-CM | POA: Diagnosis not present

## 2022-11-08 DIAGNOSIS — Z789 Other specified health status: Secondary | ICD-10-CM | POA: Diagnosis not present

## 2022-11-08 DIAGNOSIS — E782 Mixed hyperlipidemia: Secondary | ICD-10-CM | POA: Diagnosis not present

## 2022-12-15 ENCOUNTER — Other Ambulatory Visit: Payer: Self-pay | Admitting: Internal Medicine

## 2022-12-24 DIAGNOSIS — N8189 Other female genital prolapse: Secondary | ICD-10-CM | POA: Diagnosis not present

## 2022-12-31 DIAGNOSIS — Z1331 Encounter for screening for depression: Secondary | ICD-10-CM | POA: Diagnosis not present

## 2022-12-31 DIAGNOSIS — Z1339 Encounter for screening examination for other mental health and behavioral disorders: Secondary | ICD-10-CM | POA: Diagnosis not present

## 2022-12-31 DIAGNOSIS — Z0000 Encounter for general adult medical examination without abnormal findings: Secondary | ICD-10-CM | POA: Diagnosis not present

## 2023-01-02 DIAGNOSIS — N8189 Other female genital prolapse: Secondary | ICD-10-CM | POA: Diagnosis not present

## 2023-01-16 DIAGNOSIS — N8189 Other female genital prolapse: Secondary | ICD-10-CM | POA: Diagnosis not present

## 2023-01-21 DIAGNOSIS — H47393 Other disorders of optic disc, bilateral: Secondary | ICD-10-CM | POA: Diagnosis not present

## 2023-01-21 DIAGNOSIS — H5213 Myopia, bilateral: Secondary | ICD-10-CM | POA: Diagnosis not present

## 2023-01-21 DIAGNOSIS — H04123 Dry eye syndrome of bilateral lacrimal glands: Secondary | ICD-10-CM | POA: Diagnosis not present

## 2023-01-21 DIAGNOSIS — H35363 Drusen (degenerative) of macula, bilateral: Secondary | ICD-10-CM | POA: Diagnosis not present

## 2023-01-21 DIAGNOSIS — H40003 Preglaucoma, unspecified, bilateral: Secondary | ICD-10-CM | POA: Diagnosis not present

## 2023-01-21 DIAGNOSIS — H2513 Age-related nuclear cataract, bilateral: Secondary | ICD-10-CM | POA: Diagnosis not present

## 2023-01-21 DIAGNOSIS — H52203 Unspecified astigmatism, bilateral: Secondary | ICD-10-CM | POA: Diagnosis not present

## 2023-01-21 DIAGNOSIS — H524 Presbyopia: Secondary | ICD-10-CM | POA: Diagnosis not present

## 2023-01-21 DIAGNOSIS — H25013 Cortical age-related cataract, bilateral: Secondary | ICD-10-CM | POA: Diagnosis not present

## 2023-01-30 DIAGNOSIS — N812 Incomplete uterovaginal prolapse: Secondary | ICD-10-CM | POA: Diagnosis not present

## 2023-03-13 ENCOUNTER — Other Ambulatory Visit: Payer: Self-pay | Admitting: Family

## 2023-03-18 DIAGNOSIS — R5383 Other fatigue: Secondary | ICD-10-CM | POA: Diagnosis not present

## 2023-03-18 DIAGNOSIS — E559 Vitamin D deficiency, unspecified: Secondary | ICD-10-CM | POA: Diagnosis not present

## 2023-03-21 ENCOUNTER — Other Ambulatory Visit: Payer: Self-pay | Admitting: Family Medicine

## 2023-03-21 DIAGNOSIS — M810 Age-related osteoporosis without current pathological fracture: Secondary | ICD-10-CM

## 2023-03-21 DIAGNOSIS — F331 Major depressive disorder, recurrent, moderate: Secondary | ICD-10-CM | POA: Diagnosis not present

## 2023-03-21 DIAGNOSIS — G4700 Insomnia, unspecified: Secondary | ICD-10-CM | POA: Diagnosis not present

## 2023-03-21 DIAGNOSIS — F411 Generalized anxiety disorder: Secondary | ICD-10-CM | POA: Diagnosis not present

## 2023-03-21 DIAGNOSIS — I10 Essential (primary) hypertension: Secondary | ICD-10-CM | POA: Diagnosis not present

## 2023-03-21 DIAGNOSIS — N8110 Cystocele, unspecified: Secondary | ICD-10-CM | POA: Diagnosis not present

## 2023-03-21 DIAGNOSIS — E559 Vitamin D deficiency, unspecified: Secondary | ICD-10-CM | POA: Diagnosis not present

## 2023-05-03 DIAGNOSIS — N390 Urinary tract infection, site not specified: Secondary | ICD-10-CM | POA: Diagnosis not present

## 2023-05-03 DIAGNOSIS — N3000 Acute cystitis without hematuria: Secondary | ICD-10-CM | POA: Diagnosis not present

## 2023-05-09 DIAGNOSIS — M170 Bilateral primary osteoarthritis of knee: Secondary | ICD-10-CM | POA: Diagnosis not present

## 2023-05-09 DIAGNOSIS — E039 Hypothyroidism, unspecified: Secondary | ICD-10-CM | POA: Diagnosis not present

## 2023-05-09 DIAGNOSIS — I10 Essential (primary) hypertension: Secondary | ICD-10-CM | POA: Diagnosis not present

## 2023-05-09 DIAGNOSIS — N952 Postmenopausal atrophic vaginitis: Secondary | ICD-10-CM | POA: Diagnosis not present

## 2023-05-17 DIAGNOSIS — N3000 Acute cystitis without hematuria: Secondary | ICD-10-CM | POA: Diagnosis not present

## 2023-05-17 DIAGNOSIS — N952 Postmenopausal atrophic vaginitis: Secondary | ICD-10-CM | POA: Diagnosis not present

## 2023-05-17 DIAGNOSIS — N8110 Cystocele, unspecified: Secondary | ICD-10-CM | POA: Diagnosis not present

## 2023-07-19 ENCOUNTER — Encounter: Payer: Self-pay | Admitting: Internal Medicine

## 2023-07-19 ENCOUNTER — Ambulatory Visit: Payer: Medicare PPO | Admitting: Internal Medicine

## 2023-07-19 VITALS — BP 118/64 | HR 75 | Temp 97.5°F | Resp 16

## 2023-07-19 DIAGNOSIS — J302 Other seasonal allergic rhinitis: Secondary | ICD-10-CM

## 2023-07-19 DIAGNOSIS — J3089 Other allergic rhinitis: Secondary | ICD-10-CM

## 2023-07-19 DIAGNOSIS — J4520 Mild intermittent asthma, uncomplicated: Secondary | ICD-10-CM | POA: Diagnosis not present

## 2023-07-19 MED ORDER — ALBUTEROL SULFATE HFA 108 (90 BASE) MCG/ACT IN AERS: INHALATION_SPRAY | RESPIRATORY_TRACT | 2 refills | Status: AC

## 2023-07-19 MED ORDER — FLUTICASONE PROPIONATE 50 MCG/ACT NA SUSP: 2.0000 | Freq: Every day | NASAL | 11 refills | Status: AC

## 2023-07-19 NOTE — Progress Notes (Signed)
 FOLLOW UP Date of Service/Encounter:  07/19/23  Subjective:  Rebecca Murray (DOB: 08-26-1945) is a 78 y.o. female who [facility] in re-evaluation of the following: Intermittent asthma, allergic rhinitis now completed 40+ years of AIT. History obtained from: chart review and patient.  For Review, LV was on 07/19/22 with Dr.Megin Consalvo seen for routine follow-up. See below for summary of history and diagnostics.   Therapeutic plans/changes recommended: She was doing well at that visit and we discussed stopping her allergy injections since she had completed greater than 40 years with good response. ----------------------------------------------------- Pertinent History/Diagnostics:  Asthma: Mild intermittent asthma.  Infrequent use of albuterol . Allergic Rhinitis:  Continues on AIT for grass, mold, dust mites.  Has been on maintenance red vial at 0.5 mL since at least 2016, coming every 4 weeks.  Has tolerated injections well without previous reactions. Controlled on Flonase  1 SEN twice daily. Injections discontinued in summer 2024 following good response.  --------------------------------------------------- Today presents for follow-up. Discussed the use of AI scribe software for clinical note transcription with the patient, who gave verbal consent to proceed.  History of Present Illness   Rebecca Murray is a 78 year old female who presents for a routine follow-up visit.  She has not used her rescue inhaler in over a year and cannot recall the last time it was needed. She keeps it available but has not experienced any episodes necessitating its use. There has been no recent use of steroids or antibiotics for respiratory issues.  She continues to use Flonase , administering two sprays in the morning and two at night, and takes Allegra  once daily. She experiences occasional nasal drip, which is not bothersome.  In March, she had a bladder  infection treated initially with bacitracin and then switched to another antibiotic, the name of which she does not recall. Recently, she experienced bladder prolapse, contributing to a minor infection. She was referred to a gynecologist and now uses a pessary, which she inserts every morning and removes every night. She has learned to manage it herself and has completed pelvic therapy successfully.  Her family history includes her mother's side being farmers with a lineage dating back to the 8s.       All medications reviewed by clinical staff and updated in chart. No new pertinent medical or surgical history except as noted in HPI.  ROS: All others negative except as noted per HPI.   Objective:  BP 118/64   Pulse 75   Temp (!) 97.5 F (36.4 C) (Temporal)   Resp 16   SpO2 98%  There is no height or weight on file to calculate BMI. Physical Exam: General Appearance:  Alert, cooperative, no distress, appears stated age  Head:  Normocephalic, without obvious abnormality, atraumatic  Eyes:  Conjunctiva clear, EOM's intact  Ears EACs normal bilaterally and normal TMs bilaterally  Nose: Nares normal, hypertrophic turbinates, normal mucosa, and no visible anterior polyps  Throat: Lips, tongue normal; teeth and gums normal, normal posterior oropharynx  Neck: Supple, symmetrical  Lungs:   clear to auscultation bilaterally, Respirations unlabored, no coughing  Heart:  regular rate and rhythm and no murmur, Appears well perfused  Extremities: No edema  Skin: Skin color, texture, turgor normal and no rashes or lesions on visualized portions of skin  Neurologic: No gross deficits   Labs:  Lab Orders  No laboratory test(s) ordered today    Spirometry:  Tracings reviewed. Her effort: Good reproducible efforts.  FVC: 2.37L FEV1: 2.03L, 101% predicted FEV1/FVC ratio: 0.86 Interpretation: Spirometry consistent with normal pattern.  Please see scanned spirometry results for  details.  Assessment/Plan   Occasional non-bothersome rhinorrhea. No significant asthma symptoms in the past year. 1.  Continue Flonase -2 sprays each nostril daily  3.  Continue Allegra , albuterol  HFA if needed  4.  Return to clinic in 1 year or earlier if problem  Other: none  Jonathon Neighbors, MD  [facility] 

## 2023-07-19 NOTE — Patient Instructions (Signed)
  1.  Continue Flonase -2 sprays each nostril daily  3.  Continue Allegra , albuterol  HFA if needed  4.  Return to clinic in 1 year or earlier if problem

## 2023-08-23 DIAGNOSIS — R5383 Other fatigue: Secondary | ICD-10-CM | POA: Diagnosis not present

## 2023-08-23 DIAGNOSIS — E782 Mixed hyperlipidemia: Secondary | ICD-10-CM | POA: Diagnosis not present

## 2023-08-23 DIAGNOSIS — I10 Essential (primary) hypertension: Secondary | ICD-10-CM | POA: Diagnosis not present

## 2023-08-23 DIAGNOSIS — Z13220 Encounter for screening for lipoid disorders: Secondary | ICD-10-CM | POA: Diagnosis not present

## 2023-08-27 DIAGNOSIS — E782 Mixed hyperlipidemia: Secondary | ICD-10-CM | POA: Diagnosis not present

## 2023-08-27 DIAGNOSIS — M810 Age-related osteoporosis without current pathological fracture: Secondary | ICD-10-CM | POA: Diagnosis not present

## 2023-08-27 DIAGNOSIS — I10 Essential (primary) hypertension: Secondary | ICD-10-CM | POA: Diagnosis not present

## 2023-08-27 DIAGNOSIS — Z0000 Encounter for general adult medical examination without abnormal findings: Secondary | ICD-10-CM | POA: Diagnosis not present

## 2023-09-10 DIAGNOSIS — R21 Rash and other nonspecific skin eruption: Secondary | ICD-10-CM | POA: Diagnosis not present

## 2023-09-10 DIAGNOSIS — I10 Essential (primary) hypertension: Secondary | ICD-10-CM | POA: Diagnosis not present

## 2023-09-16 DIAGNOSIS — E782 Mixed hyperlipidemia: Secondary | ICD-10-CM | POA: Diagnosis not present

## 2023-09-16 DIAGNOSIS — R21 Rash and other nonspecific skin eruption: Secondary | ICD-10-CM | POA: Diagnosis not present

## 2023-09-16 DIAGNOSIS — I10 Essential (primary) hypertension: Secondary | ICD-10-CM | POA: Diagnosis not present

## 2023-10-01 DIAGNOSIS — Z23 Encounter for immunization: Secondary | ICD-10-CM | POA: Diagnosis not present

## 2023-10-11 DIAGNOSIS — N3000 Acute cystitis without hematuria: Secondary | ICD-10-CM | POA: Diagnosis not present

## 2023-10-12 DIAGNOSIS — H5789 Other specified disorders of eye and adnexa: Secondary | ICD-10-CM | POA: Diagnosis not present

## 2023-10-12 DIAGNOSIS — R232 Flushing: Secondary | ICD-10-CM | POA: Diagnosis not present

## 2023-10-12 DIAGNOSIS — T50905A Adverse effect of unspecified drugs, medicaments and biological substances, initial encounter: Secondary | ICD-10-CM | POA: Diagnosis not present

## 2023-10-12 DIAGNOSIS — N3000 Acute cystitis without hematuria: Secondary | ICD-10-CM | POA: Diagnosis not present

## 2023-10-17 DIAGNOSIS — K120 Recurrent oral aphthae: Secondary | ICD-10-CM | POA: Diagnosis not present

## 2023-10-17 DIAGNOSIS — N390 Urinary tract infection, site not specified: Secondary | ICD-10-CM | POA: Diagnosis not present

## 2023-10-17 DIAGNOSIS — I10 Essential (primary) hypertension: Secondary | ICD-10-CM | POA: Diagnosis not present

## 2023-10-17 DIAGNOSIS — R220 Localized swelling, mass and lump, head: Secondary | ICD-10-CM | POA: Diagnosis not present

## 2023-10-17 DIAGNOSIS — T50905A Adverse effect of unspecified drugs, medicaments and biological substances, initial encounter: Secondary | ICD-10-CM | POA: Diagnosis not present

## 2023-11-05 DIAGNOSIS — Z7185 Encounter for immunization safety counseling: Secondary | ICD-10-CM | POA: Diagnosis not present

## 2023-11-05 DIAGNOSIS — M170 Bilateral primary osteoarthritis of knee: Secondary | ICD-10-CM | POA: Diagnosis not present

## 2023-11-05 DIAGNOSIS — Z23 Encounter for immunization: Secondary | ICD-10-CM | POA: Diagnosis not present

## 2023-11-05 DIAGNOSIS — I10 Essential (primary) hypertension: Secondary | ICD-10-CM | POA: Diagnosis not present

## 2023-11-12 ENCOUNTER — Ambulatory Visit (HOSPITAL_BASED_OUTPATIENT_CLINIC_OR_DEPARTMENT_OTHER)
Admission: RE | Admit: 2023-11-12 | Discharge: 2023-11-12 | Disposition: A | Discharge status: Home / Self Care | Source: Ambulatory Visit | Attending: Family Medicine | Admitting: Family Medicine

## 2023-11-12 ENCOUNTER — Other Ambulatory Visit: Payer: Medicare PPO

## 2023-11-12 DIAGNOSIS — M810 Age-related osteoporosis without current pathological fracture: Secondary | ICD-10-CM | POA: Diagnosis not present

## 2023-11-12 DIAGNOSIS — Z780 Asymptomatic menopausal state: Secondary | ICD-10-CM | POA: Diagnosis not present

## 2023-11-27 DIAGNOSIS — N950 Postmenopausal bleeding: Secondary | ICD-10-CM | POA: Diagnosis not present

## 2023-12-26 DIAGNOSIS — R9389 Abnormal findings on diagnostic imaging of other specified body structures: Secondary | ICD-10-CM | POA: Diagnosis not present

## 2023-12-26 DIAGNOSIS — N950 Postmenopausal bleeding: Secondary | ICD-10-CM | POA: Diagnosis not present

## 2024-01-01 DIAGNOSIS — Z0000 Encounter for general adult medical examination without abnormal findings: Secondary | ICD-10-CM | POA: Diagnosis not present

## 2024-01-01 DIAGNOSIS — F411 Generalized anxiety disorder: Secondary | ICD-10-CM | POA: Diagnosis not present

## 2024-01-01 DIAGNOSIS — Z1331 Encounter for screening for depression: Secondary | ICD-10-CM | POA: Diagnosis not present

## 2024-01-01 DIAGNOSIS — Z1339 Encounter for screening examination for other mental health and behavioral disorders: Secondary | ICD-10-CM | POA: Diagnosis not present

## 2024-01-01 DIAGNOSIS — F331 Major depressive disorder, recurrent, moderate: Secondary | ICD-10-CM | POA: Diagnosis not present

## 2024-01-01 DIAGNOSIS — M8580 Other specified disorders of bone density and structure, unspecified site: Secondary | ICD-10-CM | POA: Diagnosis not present

## 2024-01-01 DIAGNOSIS — G4700 Insomnia, unspecified: Secondary | ICD-10-CM | POA: Diagnosis not present

## 2024-07-22 ENCOUNTER — Ambulatory Visit: Admitting: Internal Medicine
